# Patient Record
Sex: Female | Born: 1990 | Hispanic: Yes | Marital: Married | Smoking: Former smoker
Health system: Southern US, Community
[De-identification: ages and names within clinical notes are randomized; demographics above are authoritative.]

## PROBLEM LIST (undated history)

## (undated) DIAGNOSIS — B009 Herpesviral infection, unspecified: Secondary | ICD-10-CM

## (undated) DIAGNOSIS — M549 Dorsalgia, unspecified: Secondary | ICD-10-CM

## (undated) DIAGNOSIS — Z789 Other specified health status: Secondary | ICD-10-CM

## (undated) DIAGNOSIS — G8929 Other chronic pain: Secondary | ICD-10-CM

## (undated) DIAGNOSIS — K8020 Calculus of gallbladder without cholecystitis without obstruction: Secondary | ICD-10-CM

## (undated) HISTORY — PX: NO PAST SURGERIES: SHX2092

## (undated) HISTORY — PX: CHOLECYSTECTOMY: SHX55

---
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
## 2012-05-24 ENCOUNTER — Emergency Department (HOSPITAL_COMMUNITY): Payer: Self-pay

## 2012-05-24 ENCOUNTER — Encounter (HOSPITAL_COMMUNITY): Payer: Self-pay | Admitting: Emergency Medicine

## 2012-05-24 ENCOUNTER — Emergency Department (HOSPITAL_COMMUNITY)
Admission: EM | Admit: 2012-05-24 | Discharge: 2012-05-24 | Disposition: A | Discharge status: Home / Self Care | Payer: Self-pay | Attending: Emergency Medicine | Admitting: Emergency Medicine

## 2012-05-24 DIAGNOSIS — R197 Diarrhea, unspecified: Secondary | ICD-10-CM | POA: Insufficient documentation

## 2012-05-24 DIAGNOSIS — R1110 Vomiting, unspecified: Secondary | ICD-10-CM | POA: Insufficient documentation

## 2012-05-24 DIAGNOSIS — K529 Noninfective gastroenteritis and colitis, unspecified: Secondary | ICD-10-CM

## 2012-05-24 DIAGNOSIS — K5289 Other specified noninfective gastroenteritis and colitis: Secondary | ICD-10-CM | POA: Insufficient documentation

## 2012-05-24 LAB — COMPREHENSIVE METABOLIC PANEL
AST: 14 U/L (ref 0–37)
BUN: 9 mg/dL (ref 6–23)
CO2: 23 mEq/L (ref 19–32)
Calcium: 9.9 mg/dL (ref 8.4–10.5)
Chloride: 100 mEq/L (ref 96–112)
Creatinine, Ser: 0.54 mg/dL (ref 0.50–1.10)
GFR calc Af Amer: >90 mL/min (ref >90)
GFR calc non Af Amer: >90 mL/min (ref >90)
Glucose, Bld: 101 mg/dL — ABNORMAL HIGH (ref 70–99)
Total Bilirubin: 0.5 mg/dL (ref 0.3–1.2)

## 2012-05-24 LAB — LIPASE, BLOOD: Lipase: 13 U/L (ref 11–59)

## 2012-05-24 LAB — URINALYSIS, ROUTINE W REFLEX MICROSCOPIC
Hgb urine dipstick: NEGATIVE
Ketones, ur: 40 mg/dL — AB
Specific Gravity, Urine: >1.03 — ABNORMAL HIGH (ref 1.005–1.030)
Urobilinogen, UA: 0.2 mg/dL (ref 0.0–1.0)
pH: 6 (ref 5.0–8.0)

## 2012-05-24 LAB — PREGNANCY, URINE: Preg Test, Ur: NEGATIVE

## 2012-05-24 MED ORDER — SODIUM CHLORIDE 0.9 % IV SOLN
1000.0000 mL | INTRAVENOUS | Status: DC
  Administered 2012-05-24: 1000 mL via INTRAVENOUS

## 2012-05-24 MED ORDER — ONDANSETRON HCL 4 MG/2ML IJ SOLN
4.0000 mg | Freq: Once | INTRAMUSCULAR | Status: AC
  Administered 2012-05-24: 4 mg via INTRAVENOUS
  Filled 2012-05-24: qty 2

## 2012-05-24 MED ORDER — ONDANSETRON HCL 4 MG PO TABS: 4.0000 mg | ORAL_TABLET | Freq: Four times a day (QID) | ORAL | Status: DC

## 2012-05-24 NOTE — ED Notes (Signed)
Pt states upper abdominal pain which began at 0700 along with N/V/D. States she has vomited x 4 today, the last time being in waiting area. Unable to obtain urine sample at this time.

## 2012-05-24 NOTE — ED Notes (Signed)
Patient states she is feeling much better, denies nausea, denies abdominal pain and is requesting po fluids.

## 2012-05-24 NOTE — ED Notes (Signed)
Pt informed to let nurse know when nausea is better so she can drink fluids.

## 2012-05-24 NOTE — ED Provider Notes (Signed)
History     CSN: [id number]  Arrival date & time 05/24/12  1458   First MD Initiated Contact with Patient 05/24/12 1608      Chief Complaint  Patient presents with  . Abdominal Pain  . Emesis  . Diarrhea    (Consider location/radiation/quality/duration/timing/severity/associated sxs/prior treatment) HPI Comments: Stacy Wall is a 21 y.o. Female who presents for evaluation of nausea, vomiting, diarrhea, that started today. She denies fever, headache, weakness, or dizziness. She cannot recall her last menstrual cycle. She has implanted contraception. There are no modifying factors.  Patient is a 21 y.o. female presenting with abdominal pain, vomiting, and diarrhea. The history is provided by the patient.  Abdominal Pain The primary symptoms of the illness include abdominal pain, vomiting and diarrhea.  Emesis  Associated symptoms include abdominal pain and diarrhea.  Diarrhea The primary symptoms include abdominal pain, vomiting and diarrhea.    History reviewed. No pertinent past medical history.  History reviewed. No pertinent past surgical history.  No family history on file.  History  Substance Use Topics  . Smoking status: Never Smoker   . Smokeless tobacco: Not on file  . Alcohol Use: No    OB History    Grav Para Term Preterm Abortions TAB SAB Ect Mult Living                  Review of Systems  Gastrointestinal: Positive for vomiting, abdominal pain and diarrhea.  All other systems reviewed and are negative.    Allergies  Review of patient's allergies indicates no known allergies.  Home Medications   Current Outpatient Rx  Name Route Sig Dispense Refill  . ONDANSETRON HCL 4 MG PO TABS Oral Take 1 tablet (4 mg total) by mouth every 6 (six) hours. 12 tablet 0    BP 107/66  Pulse 68  Temp 97.8 F (36.6 C) (Oral)  Resp 16  Ht 5\' 4"  (1.626 m)  Wt 136 lb (61.689 kg)  BMI 23.34 kg/m2  SpO2 100%  Physical Exam  Nursing note and vitals  reviewed. Constitutional: She is oriented to person, place, and time. She appears well-developed and well-nourished.  HENT:  Head: Normocephalic and atraumatic.  Eyes: Conjunctivae normal and EOM are normal. Pupils are equal, round, and reactive to light.  Neck: Normal range of motion and phonation normal. Neck supple.  Cardiovascular: Normal rate, regular rhythm and intact distal pulses.   Pulmonary/Chest: Effort normal and breath sounds normal. She exhibits no tenderness.  Abdominal: Soft. She exhibits no distension and no mass. There is tenderness (mild epigastric). There is no guarding.  Musculoskeletal: Normal range of motion.  Neurological: She is alert and oriented to person, place, and time. She has normal strength. She exhibits normal muscle tone.  Skin: Skin is warm and dry.  Psychiatric: She has a normal mood and affect. Her behavior is normal. Judgment and thought content normal.    ED Course  Procedures (including critical care time)  Emergency department treatment: IV fluids, and IV Zofran. Trial of oral fluids  Reevaluation at discharge: comfortable, and tolerating oral fluids.   Nursing notes, applicable records and vitals reviewed.repeat vital signs, stable  Radiologic Images/Reports reviewed.   Labs Reviewed  URINALYSIS, ROUTINE W REFLEX MICROSCOPIC - Abnormal; Notable for the following:    Specific Gravity, Urine >1.030 (*)     Bilirubin Urine SMALL (*)     Ketones, ur 40 (*)     All other components within normal limits  COMPREHENSIVE METABOLIC  PANEL - Abnormal; Notable for the following:    Glucose, Bld 101 (*)     Total Protein 8.6 (*)     All other components within normal limits  PREGNANCY, URINE  LIPASE, BLOOD   Dg Abd Acute W/chest  05/24/2012  *RADIOLOGY REPORT*  Clinical Data: Acute to that the abdominal pain and emesis. Diarrhea.  ACUTE ABDOMEN SERIES (ABDOMEN 2 VIEW & CHEST 1 VIEW)  Comparison: None.  Findings: The heart size is normal.  The  lungs are clear.  Gaseous distended loops of small bowel are present within the lower abdomen and pelvis.  There is no evidence for obstruction or free air.  Normal appearing gas is present throughout the colon.  The axial skeleton is within normal limits.  IMPRESSION:  1.  Mild gaseous distention of loops of the small bowel may represent a focal ileus. 2.  Normal appearance of the colon.   Original Report Authenticated By: Jamesetta Orleans. MATTERN, M.D.      1. Gastroenteritis       MDM  Senses symptoms of viral gastroenteritis.  Patient is nontoxic.Doubt metabolic instability, serious bacterial infection or impending vascular collapse; the patient is stable for discharge.    Plan: Home Medications- Zofran; Home Treatments- gradually advance diet; Recommended follow up- PCP of choice when necessary.         Flint Melter, MD 05/24/12 2203

## 2012-05-24 NOTE — ED Notes (Signed)
Pt c/o upper abd pain with n/v/d today

## 2012-05-24 NOTE — ED Notes (Signed)
Pt has had no vomiting since arrival to room.

## 2012-06-22 ENCOUNTER — Encounter (HOSPITAL_COMMUNITY): Payer: Self-pay | Admitting: Emergency Medicine

## 2012-06-22 ENCOUNTER — Emergency Department (HOSPITAL_COMMUNITY)
Admission: EM | Admit: 2012-06-22 | Discharge: 2012-06-22 | Disposition: A | Discharge status: Home / Self Care | Payer: Self-pay | Attending: Emergency Medicine | Admitting: Emergency Medicine

## 2012-06-22 DIAGNOSIS — L0390 Cellulitis, unspecified: Secondary | ICD-10-CM

## 2012-06-22 DIAGNOSIS — L0231 Cutaneous abscess of buttock: Secondary | ICD-10-CM | POA: Insufficient documentation

## 2012-06-22 MED ORDER — CEPHALEXIN 500 MG PO CAPS: ORAL_CAPSULE | ORAL | Status: DC

## 2012-06-22 MED ORDER — OXYCODONE-ACETAMINOPHEN 5-325 MG PO TABS: 2.0000 | ORAL_TABLET | ORAL | Status: DC | PRN

## 2012-06-22 MED ORDER — SULFAMETHOXAZOLE-TRIMETHOPRIM 800-160 MG PO TABS: 1.0000 | ORAL_TABLET | Freq: Two times a day (BID) | ORAL | Status: DC

## 2012-06-22 NOTE — ED Notes (Signed)
Pt c/o abscess to right buttock.  

## 2012-06-22 NOTE — ED Provider Notes (Signed)
History   This chart was scribed for Hurman Horn, MD by Charolett Bumpers, ED Scribe. The patient was seen in room APA11/[facility]11. Patient's care was started at 1539.   CSN: [id number]  Arrival date & time 06/22/12  1447   First MD Initiated Contact with Patient 06/22/12 1539      Chief Complaint  Patient presents with  . Abscess    The history is provided by the patient. No language interpreter was used.  Stacy Wall is a 21 y.o. female who presents to the Emergency Department complaining of an abscess to her right buttocks. She describes the associated pain as constant and severe, that is worse with palpation. She denies any modifying factors. She denies any fevers, nausea, vomiting, abdominal pain, chest pain, SOB or rashes. She states that she is otherwise normally healthy.    History reviewed. No pertinent past medical history.  History reviewed. No pertinent past surgical history.  No family history on file.  History  Substance Use Topics  . Smoking status: Never Smoker   . Smokeless tobacco: Not on file  . Alcohol Use: No    OB History    Grav Para Term Preterm Abortions TAB SAB Ect Mult Living                  Review of Systems A complete 10 system review of systems was obtained and all systems are negative except as noted in the HPI and PMH.   Allergies  Review of patient's allergies indicates no known allergies.  Home Medications   Current Outpatient Rx  Name  Route  Sig  Dispense  Refill  . CEPHALEXIN 500 MG PO CAPS      2 caps po bid x 7 days   28 capsule   0   . ONDANSETRON HCL 4 MG PO TABS   Oral   Take 1 tablet (4 mg total) by mouth every 6 (six) hours.   12 tablet   0   . OXYCODONE-ACETAMINOPHEN 5-325 MG PO TABS   Oral   Take 2 tablets by mouth every 4 (four) hours as needed for pain.   20 tablet   0   . SULFAMETHOXAZOLE-TRIMETHOPRIM 800-160 MG PO TABS   Oral   Take 1 tablet by mouth 2 (two) times daily. One po bid x 7  days   14 tablet   0   . SULFAMETHOXAZOLE-TRIMETHOPRIM 800-160 MG PO TABS   Oral   Take 1 tablet by mouth every 12 (twelve) hours.   20 tablet   0     BP 131/84  Pulse 90  Temp 97.5 F (36.4 C)  Resp 18  Ht 5\' 4"  (1.626 m)  Wt 140 lb (63.504 kg)  BMI 24.03 kg/m2  SpO2 97%  Physical Exam  Nursing note and vitals reviewed. Constitutional:       Awake, alert, nontoxic appearance.  HENT:  Head: Atraumatic.  Eyes: Right eye exhibits no discharge. Left eye exhibits no discharge.  Neck: Neck supple.  Cardiovascular: Normal rate, regular rhythm and normal heart sounds.   No murmur heard. Pulmonary/Chest: Effort normal and breath sounds normal. No respiratory distress. She has no wheezes. She has no rales. She exhibits no tenderness.  Abdominal: Soft. There is no tenderness. There is no rebound.  Musculoskeletal: She exhibits no tenderness.       Baseline ROM, no obvious new focal weakness.  Neurological:       Mental status and motor strength appears  baseline for patient and situation.  Skin: No rash noted. There is erythema.       Right lateral buttocks, a 2 cm subcutaneous nodule that is firm with 3 cm diameter of mild erythema.  Psychiatric: She has a normal mood and affect.    ED Course  Procedures (including critical care time)  DIAGNOSTIC STUDIES: Oxygen Saturation is 97% on room air, adequate by my interpretation.    COORDINATION OF CARE:  15:45-Patient / Family / Caregiver understand and agree with initial ED impression and plan with expectations set for ED visit.   15:50-Limited bedside ultrasound preformed, no obvious focal fluid collection to suggest abscess is read for drainage.   15:55-Patient / Family / Caregiver informed of clinical course, understand medical decision-making process, and agree with plan. Will d/c pt home with antibiotics and discussed using warm compresses and f/u in 2 days for recheck   Labs Reviewed - No data to display No results  found.   1. Cellulitis       MDM    I personally performed the services described in this documentation, which was scribed in my presence. The recorded information has been reviewed and is accurate.  Pt stable in ED with no significant deterioration in condition.  Patient / Family / Caregiver informed of clinical course, understand medical decision-making process, and agree with plan.  I doubt any other EMC precluding discharge at this time including, but not necessarily limited to the following:abscess, sepsis, nec fasc.     Hurman Horn, MD 06/25/12 [phone]

## 2012-06-22 NOTE — ED Notes (Signed)
Pt presents with abscess on buttocks that she first noticed "a few gays ago". Pt states "I think i was bit by something".

## 2012-06-25 ENCOUNTER — Encounter (HOSPITAL_COMMUNITY): Payer: Self-pay | Admitting: Emergency Medicine

## 2012-06-25 ENCOUNTER — Emergency Department (HOSPITAL_COMMUNITY)
Admission: EM | Admit: 2012-06-25 | Discharge: 2012-06-25 | Disposition: A | Discharge status: Home / Self Care | Payer: Self-pay | Attending: Emergency Medicine | Admitting: Emergency Medicine

## 2012-06-25 DIAGNOSIS — L0231 Cutaneous abscess of buttock: Secondary | ICD-10-CM | POA: Insufficient documentation

## 2012-06-25 DIAGNOSIS — Z79899 Other long term (current) drug therapy: Secondary | ICD-10-CM | POA: Insufficient documentation

## 2012-06-25 MED ORDER — LIDOCAINE-EPINEPHRINE (PF) 1 %-1:200000 IJ SOLN
10.0000 mL | Freq: Once | INTRAMUSCULAR | Status: AC
  Administered 2012-06-25: 10 mL

## 2012-06-25 MED ORDER — SULFAMETHOXAZOLE-TRIMETHOPRIM 800-160 MG PO TABS: 1.0000 | ORAL_TABLET | Freq: Two times a day (BID) | ORAL | Status: DC

## 2012-06-25 MED ORDER — LIDOCAINE-EPINEPHRINE (PF) 1 %-1:200000 IJ SOLN
INTRAMUSCULAR | Status: AC
  Administered 2012-06-25: 10 mL
  Filled 2012-06-25: qty 10

## 2012-06-25 NOTE — ED Notes (Signed)
When edp arrived, pt stated was prescribed meds on Monday when she was here but only got the pain meds filled. Pt instructed the importance of anitbiotics and to take all of them.

## 2012-06-25 NOTE — ED Provider Notes (Signed)
History     CSN: [id number]  Arrival date & time 06/25/12  4098   First MD Initiated Contact with Patient 06/25/12 [phone]      Chief Complaint  Patient presents with  . Abscess    (Consider location/radiation/quality/duration/timing/severity/associated sxs/prior treatment) HPI Comments: 21 year old female with a history of a recent visit for an infection in the right buttock who presents with a complaint of worsening swelling and pain. This started one week ago, gradual in onset, gradually worsening, persistent, worse with sitting, not associated with fevers chills nausea or vomiting. She denies being a diabetic, she denies any risk factors for HIV and states that she thinks that she was bitten by an insect and then try to mash the area that was read after which time he became worse, more swollen, more tender and now has redness which is spreading circumferentially.  She admits that after her last visit she was prescribed several medications but only filled the pain medication and chose not to fill the antibiotics.  Patient is a 21 y.o. female presenting with abscess. The history is provided by the patient and medical records.  Abscess  Pertinent negatives include no fever.    History reviewed. No pertinent past medical history.  History reviewed. No pertinent past surgical history.  History reviewed. No pertinent family history.  History  Substance Use Topics  . Smoking status: Never Smoker   . Smokeless tobacco: Not on file  . Alcohol Use: No    OB History    Grav Para Term Preterm Abortions TAB SAB Ect Mult Living                  Review of Systems  Constitutional: Negative for fever.  Skin: Positive for rash.    Allergies  Review of patient's allergies indicates no known allergies.  Home Medications   Current Outpatient Rx  Name  Route  Sig  Dispense  Refill  . CEPHALEXIN 500 MG PO CAPS      2 caps po bid x 7 days   28 capsule   0   . ONDANSETRON HCL 4 MG  PO TABS   Oral   Take 1 tablet (4 mg total) by mouth every 6 (six) hours.   12 tablet   0   . OXYCODONE-ACETAMINOPHEN 5-325 MG PO TABS   Oral   Take 2 tablets by mouth every 4 (four) hours as needed for pain.   20 tablet   0   . SULFAMETHOXAZOLE-TRIMETHOPRIM 800-160 MG PO TABS   Oral   Take 1 tablet by mouth 2 (two) times daily. One po bid x 7 days   14 tablet   0   . SULFAMETHOXAZOLE-TRIMETHOPRIM 800-160 MG PO TABS   Oral   Take 1 tablet by mouth every 12 (twelve) hours.   20 tablet   0     BP 118/72  Pulse 102  Temp 98.5 F (36.9 C) (Oral)  Resp 20  SpO2 98%  Physical Exam  Constitutional: She appears well-developed and well-nourished. No distress.  HENT:  Head: Normocephalic and atraumatic.  Eyes: Conjunctivae normal are normal. No scleral icterus.  Pulmonary/Chest: Effort normal.  Musculoskeletal: Normal range of motion. She exhibits tenderness (focal tenderness over the right lateral buttock). She exhibits no edema.  Neurological: She is alert. Coordination normal.       Normal steady gait, normal speech, normal memory. Follows commands without difficulty  Skin: Skin is warm and dry. Rash noted. She is not diaphoretic. There  is erythema.       6 cm area of erythema with induration, small amount of central fluctuance.    ED Course  Procedures (including critical care time)  Labs Reviewed - No data to display No results found.   1. Abscess of right buttock       MDM  Due to the area of erythema on the right buttock, incision and drainage was performed with a incision from a #11 scalpel.  The patient is not a diabetic, has normal vital signs including no fever, no hypotension and has no risk factors for HIV. It seems that the inciting event for her abscess was a break in the skin related to an insect bite. At this time she should be treated adequately with incision and drainage followed by an antibiotic for this rounding small amount of cellulitis. The  patient is stable for discharge, I have explained her discharge instructions and the indications for return, she is expressed her understanding.  INCISION AND DRAINAGE Performed by: Eber Hong D Consent: Verbal consent obtained. Risks and benefits: risks, benefits and alternatives were discussed Type: abscess  Body area: Right buttock  Anesthesia: local infiltration  Incision was made with a scalpel.  Local anesthetic: lidocaine 1 % with epinephrine  Anesthetic total: 3 ml  Complexity: complex Blunt dissection to break up loculations, irrigation   Drainage: purulent  Drainage amount: Moderate   Packing material: 1/4 in iodoform gauze  Patient tolerance: Patient tolerated the procedure well with no immediate complications.           Vida Roller, MD 06/25/12 [phone]

## 2012-06-25 NOTE — ED Notes (Signed)
Pt c/o boil to r buttock x 1 week. Here Monday for same but states nothing done. Nad. Denies draining. States has gotten bigger.

## 2012-07-13 ENCOUNTER — Emergency Department (HOSPITAL_COMMUNITY): Admission: EM | Admit: 2012-07-13 | Discharge: 2012-07-13 | Discharge status: Against Medical Advice | Payer: Self-pay

## 2012-07-13 ENCOUNTER — Emergency Department (HOSPITAL_COMMUNITY)
Admission: EM | Admit: 2012-07-13 | Discharge: 2012-07-13 | Disposition: A | Discharge status: Home / Self Care | Payer: Self-pay | Attending: Emergency Medicine | Admitting: Emergency Medicine

## 2012-07-13 ENCOUNTER — Encounter (HOSPITAL_COMMUNITY): Payer: Self-pay | Admitting: *Deleted

## 2012-07-13 DIAGNOSIS — Z3202 Encounter for pregnancy test, result negative: Secondary | ICD-10-CM | POA: Insufficient documentation

## 2012-07-13 DIAGNOSIS — A6000 Herpesviral infection of urogenital system, unspecified: Secondary | ICD-10-CM | POA: Insufficient documentation

## 2012-07-13 LAB — URINALYSIS, ROUTINE W REFLEX MICROSCOPIC
Ketones, ur: NEGATIVE mg/dL
Leukocytes, UA: NEGATIVE
Nitrite: NEGATIVE
Protein, ur: NEGATIVE mg/dL
Urobilinogen, UA: 0.2 mg/dL (ref 0.0–1.0)

## 2012-07-13 LAB — WET PREP, GENITAL

## 2012-07-13 LAB — POCT PREGNANCY, URINE: Preg Test, Ur: NEGATIVE

## 2012-07-13 MED ORDER — HYDROCODONE-ACETAMINOPHEN 5-325 MG PO TABS: 1.0000 | ORAL_TABLET | ORAL | Status: DC | PRN

## 2012-07-13 MED ORDER — ACYCLOVIR 400 MG PO TABS: 400.0000 mg | ORAL_TABLET | Freq: Every day | ORAL | Status: DC

## 2012-07-13 NOTE — ED Notes (Signed)
Pt with rash to vagina since Friday, denies itching, denies shaving, denies any abnormal vaginal d/c

## 2012-07-13 NOTE — ED Provider Notes (Signed)
History    This chart was scribed for Stacy Booze, MD, MD by Smitty Pluck, ED Scribe. The patient was seen in room APA12 and the patient's care was started at 7:47PM.   CSN: [id number]  Arrival date & time 07/13/12  1750      Chief Complaint  Patient presents with  . Rash    (Consider location/radiation/quality/duration/timing/severity/associated sxs/prior treatment) The history is provided by the patient. No language interpreter was used.   Stacy Wall is a 21 y.o. female who presents to the Emergency Department complaining of constant, moderate vaginal rash onset 3 days ago. Pt reports having pain in area of rash while urinating. She denies itching, vaginal discharge, fever, nausea, vomiting, dysuria, back pain, abdominal pain and any other pain. Pt reports that she has vaginal bleeding after sexual intercourse for past 2 weeks.   History reviewed. No pertinent past medical history.  History reviewed. No pertinent past surgical history.  History reviewed. No pertinent family history.  History  Substance Use Topics  . Smoking status: Never Smoker   . Smokeless tobacco: Not on file  . Alcohol Use: No    OB History    Grav Para Term Preterm Abortions TAB SAB Ect Mult Living                  Review of Systems  All other systems reviewed and are negative.    Allergies  Review of patient's allergies indicates no known allergies.  Home Medications   Current Outpatient Rx  Name  Route  Sig  Dispense  Refill  . ONDANSETRON HCL 4 MG PO TABS   Oral   Take 1 tablet (4 mg total) by mouth every 6 (six) hours.   12 tablet   0     BP 132/86  Pulse 63  Temp 98.1 F (36.7 C) (Oral)  Resp 20  Ht 5\' 4"  (1.626 m)  Wt 143 lb 4 oz (64.978 kg)  BMI 24.59 kg/m2  SpO2 100%  Physical Exam  Nursing note and vitals reviewed. Constitutional: She is oriented to person, place, and time. She appears well-developed and well-nourished. No distress.  HENT:  Head:  Normocephalic and atraumatic.  Eyes: Conjunctivae normal are normal.  Cardiovascular: Normal rate, regular rhythm and normal heart sounds.   Pulmonary/Chest: Effort normal. No respiratory distress.  Abdominal: Soft. She exhibits no distension. There is no tenderness.  Genitourinary:       Normal external female genitalia. There is a vesicle on an erythematous base in the right perineal area. This vesicle is tender and is most consistent with herpes simplex. Cervix is normal. Fundus is normal size and position. There is no cervical motion tenderness. There are no adnexal masses or tenderness.  Neurological: She is oriented to person, place, and time.  Skin: Skin is warm and dry.  Psychiatric: She has a normal mood and affect. Her behavior is normal.    ED Course  Procedures (including critical care time) DIAGNOSTIC STUDIES: Oxygen Saturation is 100% on room air, normal by my interpretation.    COORDINATION OF CARE: 7:50 PM Discussed ED treatment with pt    1. Herpes genitalis       MDM  Painful rash which seems to be lesion of herpes simplex. Vaginal discharge of uncertain cause-wet prep is pending. Recent records were reviewed, and she has been on antibiotics for her cellulitis and perirectal abscess, so she would be at risk for vaginal yeast infections.  Wet prep shows no evidence  of yeast. She'll be sent home with prescription for acyclovir your and also a prescription for Norco for pain.  I personally performed the services described in this documentation, which was scribed in my presence. The recorded information has been reviewed and is accurate.         Stacy Booze, MD 07/13/12 2312

## 2012-07-13 NOTE — ED Notes (Signed)
Pt discharged. Pt stable at time of discharge. Medications reviewed pt has no questions regarding discharge at this time. Pt voiced understanding of discharge instructions.  

## 2012-07-13 NOTE — ED Notes (Signed)
Pt called for second time for triage without response

## 2012-07-13 NOTE — ED Notes (Signed)
Called for third time without response  

## 2012-07-13 NOTE — ED Notes (Signed)
Called for triage x1 without response 

## 2012-07-14 LAB — RPR: RPR Ser Ql: NONREACTIVE

## 2012-07-14 LAB — HIV ANTIBODY (ROUTINE TESTING W REFLEX): HIV: NONREACTIVE

## 2012-08-05 ENCOUNTER — Emergency Department (HOSPITAL_COMMUNITY)
Admission: EM | Admit: 2012-08-05 | Discharge: 2012-08-05 | Disposition: A | Discharge status: Home / Self Care | Payer: Self-pay | Attending: Emergency Medicine | Admitting: Emergency Medicine

## 2012-08-05 ENCOUNTER — Encounter (HOSPITAL_COMMUNITY): Payer: Self-pay | Admitting: *Deleted

## 2012-08-05 DIAGNOSIS — R21 Rash and other nonspecific skin eruption: Secondary | ICD-10-CM | POA: Insufficient documentation

## 2012-08-05 DIAGNOSIS — J3489 Other specified disorders of nose and nasal sinuses: Secondary | ICD-10-CM | POA: Insufficient documentation

## 2012-08-05 DIAGNOSIS — J329 Chronic sinusitis, unspecified: Secondary | ICD-10-CM | POA: Insufficient documentation

## 2012-08-05 DIAGNOSIS — R0982 Postnasal drip: Secondary | ICD-10-CM | POA: Insufficient documentation

## 2012-08-05 DIAGNOSIS — A6000 Herpesviral infection of urogenital system, unspecified: Secondary | ICD-10-CM | POA: Insufficient documentation

## 2012-08-05 HISTORY — DX: Herpesviral infection, unspecified: B00.9

## 2012-08-05 MED ORDER — PSEUDOEPHEDRINE HCL 60 MG PO TABS: ORAL_TABLET | ORAL | Status: DC

## 2012-08-05 MED ORDER — PSEUDOEPHEDRINE HCL 60 MG PO TABS
60.0000 mg | ORAL_TABLET | Freq: Once | ORAL | Status: AC
  Administered 2012-08-05: 60 mg via ORAL
  Filled 2012-08-05: qty 1

## 2012-08-05 MED ORDER — ACYCLOVIR 400 MG PO TABS: 400.0000 mg | ORAL_TABLET | Freq: Every day | ORAL | Status: DC

## 2012-08-05 NOTE — ED Notes (Addendum)
Pt says she has  outbreak of genital herpes.needs meds. Headache for 2 days

## 2012-08-05 NOTE — ED Provider Notes (Signed)
Medical screening examination/treatment/procedure(s) were performed by non-physician practitioner and as supervising physician I was immediately available for consultation/collaboration.   Arron Mcnaught L Matin Mattioli, MD 08/05/12 1523 

## 2012-08-05 NOTE — ED Provider Notes (Signed)
History     CSN: [id number]  Arrival date & time 08/05/12  1346   First MD Initiated Contact with Patient 08/05/12 1401      Chief Complaint  Patient presents with  . Pelvic Pain    (Consider location/radiation/quality/duration/timing/severity/associated sxs/prior treatment) Patient is a 22 y.o. female presenting with pelvic pain. The history is provided by the patient.  Pelvic Pain This is a recurrent problem. The current episode started 1 to 4 weeks ago. The problem occurs constantly. The problem has been gradually worsening. Associated symptoms include congestion and a rash. Pertinent negatives include no abdominal pain, arthralgias, chest pain, coughing, neck pain or sore throat. Associated symptoms comments: Vaginal and perineal pain. Nothing aggravates the symptoms. Treatments tried: finished acyclovir 3 to 4 days ago. The treatment provided no relief.    Past Medical History  Diagnosis Date  . Herpes     History reviewed. No pertinent past surgical history.  History reviewed. No pertinent family history.  History  Substance Use Topics  . Smoking status: Never Smoker   . Smokeless tobacco: Not on file  . Alcohol Use: No    OB History    Grav Para Term Preterm Abortions TAB SAB Ect Mult Living                  Review of Systems  Constitutional: Negative for activity change.       All ROS Neg except as noted in HPI  HENT: Positive for congestion and postnasal drip. Negative for nosebleeds, sore throat and neck pain.   Eyes: Negative for photophobia and discharge.  Respiratory: Negative for cough, shortness of breath and wheezing.   Cardiovascular: Negative for chest pain and palpitations.  Gastrointestinal: Negative for abdominal pain and blood in stool.  Genitourinary: Positive for genital sores and pelvic pain. Negative for dysuria, frequency and hematuria.  Musculoskeletal: Negative for back pain and arthralgias.  Skin: Positive for rash.  Neurological:  Negative for dizziness, seizures and speech difficulty.  Psychiatric/Behavioral: Negative for hallucinations and confusion.    Allergies  Review of patient's allergies indicates no known allergies.  Home Medications   Current Outpatient Rx  Name  Route  Sig  Dispense  Refill  . ACYCLOVIR 400 MG PO TABS   Oral   Take 1 tablet (400 mg total) by mouth 5 (five) times daily.   50 tablet   0   . HYDROCODONE-ACETAMINOPHEN 5-325 MG PO TABS   Oral   Take 1 tablet by mouth every 4 (four) hours as needed for pain.   15 tablet   0   . IBUPROFEN 200 MG PO TABS   Oral   Take 400 mg by mouth every 6 (six) hours as needed. For pain         . ACYCLOVIR 400 MG PO TABS   Oral   Take 1 tablet (400 mg total) by mouth 5 (five) times daily.   50 tablet   0   . PSEUDOEPHEDRINE HCL 60 MG PO TABS      1 po tid for congestion   30 tablet   0     BP 120/80  Pulse 73  Temp 98.1 F (36.7 C) (Oral)  Resp 20  Ht 5\' 5"  (1.651 m)  Wt 144 lb 6 oz (65.488 kg)  BMI 24.03 kg/m2  SpO2 100%  Physical Exam  Nursing note and vitals reviewed. Constitutional: She is oriented to person, place, and time. She appears well-developed and well-nourished.  Non-toxic appearance.  HENT:  Head: Normocephalic.  Right Ear: Tympanic membrane and external ear normal.  Left Ear: Tympanic membrane and external ear normal.       Nasal congestion present.  Eyes: EOM and lids are normal. Pupils are equal, round, and reactive to light.  Neck: Normal range of motion. Neck supple. Carotid bruit is not present.  Cardiovascular: Normal rate, regular rhythm, normal heart sounds, intact distal pulses and normal pulses.   Pulmonary/Chest: Breath sounds normal. No respiratory distress.  Abdominal: Soft. Bowel sounds are normal. There is no tenderness. There is no guarding.  Genitourinary:       The labia majora are well within normal limits. There no lesions noted of the clitoral shaft. There are clusters of ulcers with  an erythematous base of the external labia and extending into the perineal area. There is no red streaking present. There no hot areas appreciated. And there is no palpable inguinal nodes appreciated.  Musculoskeletal: Normal range of motion.  Lymphadenopathy:       Head (right side): No submandibular adenopathy present.       Head (left side): No submandibular adenopathy present.    She has no cervical adenopathy.  Neurological: She is alert and oriented to person, place, and time. She has normal strength. No cranial nerve deficit or sensory deficit.  Skin: Skin is warm and dry.  Psychiatric: She has a normal mood and affect. Her speech is normal.    ED Course  Procedures (including critical care time)  Labs Reviewed - No data to display No results found.   1. Herpes genitalia   2. Sinusitis       MDM  I have reviewed nursing notes, vital signs, and all appropriate lab and imaging results for this patient.  Patient has a history of herpes genitalia. She was treated recently with acyclovir. She finished this approximately 2-3 days ago and had another outbreak. The patient does state that she has had a cold recently, she also states she's been under increased stress recently. The patient was found to have what appears to be herpetic lesions of the external labia extending into the perineal area. The patient was treated with acyclovir and advised to see the physicians at the [facility] department for additional testing and management. The patient also has nasal congestion and was treated with Sudafed 3 times daily.      Kathie Dike, [place] 08/05/12 1521

## 2012-11-26 ENCOUNTER — Encounter (HOSPITAL_COMMUNITY): Payer: Self-pay | Admitting: Adult Health

## 2012-11-26 ENCOUNTER — Emergency Department (HOSPITAL_COMMUNITY)
Admission: EM | Admit: 2012-11-26 | Discharge: 2012-11-26 | Disposition: A | Discharge status: Home / Self Care | Payer: Self-pay | Attending: Emergency Medicine | Admitting: Emergency Medicine

## 2012-11-26 DIAGNOSIS — A6000 Herpesviral infection of urogenital system, unspecified: Secondary | ICD-10-CM | POA: Insufficient documentation

## 2012-11-26 DIAGNOSIS — Z760 Encounter for issue of repeat prescription: Secondary | ICD-10-CM | POA: Insufficient documentation

## 2012-11-26 MED ORDER — ACYCLOVIR 400 MG PO TABS: 400.0000 mg | ORAL_TABLET | Freq: Three times a day (TID) | ORAL | Status: DC

## 2012-11-26 NOTE — ED Provider Notes (Signed)
History    This chart was scribed for Marlon Pel (PA) non-physician practitioner working with Doug Sou, MD by Sofie Rower, ED Scribe. This patient was seen in room TR07C/TR07C and the patient's care was started at 3:52PM.   CSN: [id number]  Arrival date & time 11/26/12  1536   First MD Initiated Contact with Patient 11/26/12 1552      Chief Complaint  Patient presents with  . Medication Refill    (Consider location/radiation/quality/duration/timing/severity/associated sxs/prior treatment) The history is provided by the patient. No language interpreter was used.    Stacy Wall is a 22 y.o. female , with a hx of herpres, who presents to the Emergency Department complaining of Acyclovir medication refill, onset today (11/26/12).  The pt reports she is experiencing a genital herpes outbreak at the present time and has recently run out of her regularly scheduled acyclovir medication. No other symptoms at this time.  The pt does not smoke or drink alcohol.   PCP is Dr.    Past Medical History  Diagnosis Date  . Herpes     History reviewed. No pertinent past surgical history.  History reviewed. No pertinent family history.  History  Substance Use Topics  . Smoking status: Never Smoker   . Smokeless tobacco: Not on file  . Alcohol Use: No    OB History   Grav Para Term Preterm Abortions TAB SAB Ect Mult Living                  Review of Systems  All other systems reviewed and are negative.    Allergies  Review of patient's allergies indicates no known allergies.  Home Medications   Current Outpatient Rx  Name  Route  Sig  Dispense  Refill  . acyclovir (ZOVIRAX) 400 MG tablet   Oral   Take 1 tablet (400 mg total) by mouth 3 (three) times daily.   30 tablet   2     BP 108/65  Pulse 94  Temp(Src) 98 F (36.7 C) (Oral)  Resp 16  SpO2 95%  Physical Exam  Nursing note and vitals reviewed. Constitutional: She is oriented to person, place, and  time. She appears well-developed and well-nourished. No distress.  HENT:  Head: Normocephalic and atraumatic.  Eyes: EOM are normal.  Neck: Neck supple. No tracheal deviation present.  Cardiovascular: Normal rate.   Pulmonary/Chest: Effort normal. No respiratory distress.  Genitourinary:     Musculoskeletal: Normal range of motion.  Neurological: She is alert and oriented to person, place, and time.  Skin: Skin is warm and dry.  Psychiatric: She has a normal mood and affect. Her behavior is normal.    ED Course  Procedures (including critical care time)  DIAGNOSTIC STUDIES: Oxygen Saturation is 95% on room air, normal by my interpretation.    COORDINATION OF CARE:  4:05 PM- Treatment plan discussed with patient. Pt agrees with treatment.  acyclovir (ZOVIRAX) 400 MG tablet Take 1 tablet (400 mg total) by mouth 3 (three) times daily. 30 tablet Dorthula Matas, PA-  Referral to [facility] Reviewed - No data to display No results found.   1. Genital herpes       MDM   Pt has been advised of the symptoms that warrant their return to the ED. Patient has voiced understanding and has agreed to follow-up with the PCP or specialist.  I personally performed the services described in this documentation, which was scribed in my presence. The  recorded information has been reviewed and is accurate.   Dorthula Matas, PA-C 11/26/12 1614

## 2012-11-26 NOTE — ED Notes (Addendum)
presents with herpes outbreak, ran out of RX, requesting prescription of Acyclovir

## 2012-11-26 NOTE — ED Notes (Signed)
Pt states she just needs more of her herpes medication d/t experiencing a typical herpes outbreak.

## 2012-11-27 NOTE — ED Provider Notes (Signed)
Medical screening examination/treatment/procedure(s) were performed by non-physician practitioner and as supervising physician I was immediately available for consultation/collaboration.  Ravindra Baranek, MD 11/27/12 0136 

## 2013-01-01 ENCOUNTER — Emergency Department (HOSPITAL_COMMUNITY)
Admission: EM | Admit: 2013-01-01 | Discharge: 2013-01-01 | Disposition: A | Discharge status: Home / Self Care | Payer: Self-pay | Attending: Emergency Medicine | Admitting: Emergency Medicine

## 2013-01-01 ENCOUNTER — Encounter (HOSPITAL_COMMUNITY): Payer: Self-pay | Admitting: Emergency Medicine

## 2013-01-01 ENCOUNTER — Emergency Department (HOSPITAL_COMMUNITY): Payer: Self-pay

## 2013-01-01 DIAGNOSIS — Y92838 Other recreation area as the place of occurrence of the external cause: Secondary | ICD-10-CM | POA: Insufficient documentation

## 2013-01-01 DIAGNOSIS — S63619A Unspecified sprain of unspecified finger, initial encounter: Secondary | ICD-10-CM

## 2013-01-01 DIAGNOSIS — W219XXA Striking against or struck by unspecified sports equipment, initial encounter: Secondary | ICD-10-CM | POA: Insufficient documentation

## 2013-01-01 DIAGNOSIS — Y9368 Activity, volleyball (beach) (court): Secondary | ICD-10-CM | POA: Insufficient documentation

## 2013-01-01 DIAGNOSIS — Y9239 Other specified sports and athletic area as the place of occurrence of the external cause: Secondary | ICD-10-CM | POA: Insufficient documentation

## 2013-01-01 DIAGNOSIS — Z8619 Personal history of other infectious and parasitic diseases: Secondary | ICD-10-CM | POA: Insufficient documentation

## 2013-01-01 DIAGNOSIS — S6390XA Sprain of unspecified part of unspecified wrist and hand, initial encounter: Secondary | ICD-10-CM | POA: Insufficient documentation

## 2013-01-01 MED ORDER — IBUPROFEN 600 MG PO TABS: 600.0000 mg | ORAL_TABLET | Freq: Four times a day (QID) | ORAL | Status: DC | PRN

## 2013-01-01 NOTE — ED Notes (Signed)
Pt c/o pain to right ring finger after injuring it playing volleyball yesterday.

## 2013-01-01 NOTE — ED Provider Notes (Signed)
Medical screening examination/treatment/procedure(s) were performed by non-physician practitioner and as supervising physician I was immediately available for consultation/collaboration. Devoria Albe, MD, Armando Gang   Ward Givens, MD 01/01/13 1115

## 2013-01-01 NOTE — ED Provider Notes (Signed)
History     CSN: [id number]  Arrival date & time 01/01/13  0935   First MD Initiated Contact with Patient 01/01/13 [phone]      Chief Complaint  Patient presents with  . Finger Injury    (Consider location/radiation/quality/duration/timing/severity/associated sxs/prior treatment) HPI Comments: Stacy Wall is a 22 y.o. Female with persistent pain in her right long and ring finger since she jammed the fingers on the ball during a volleyball game yesterday afternoon.  She describes persistent pain which is worse with attempts to flex her fingers with pain that radiates into her hand.  She had swelling and bruising at the base of the fingers yesterday which she states improved with ice packs.  She has taken no medications for her symptoms.  She denies numbness distal to the injury site.  She is right-handed.     The history is provided by the patient.    Past Medical History  Diagnosis Date  . Herpes     History reviewed. No pertinent past surgical history.  No family history on file.  History  Substance Use Topics  . Smoking status: Never Smoker   . Smokeless tobacco: Not on file  . Alcohol Use: No    OB History   Grav Para Term Preterm Abortions TAB SAB Ect Mult Living                  Review of Systems  Constitutional: Negative for fever.  Musculoskeletal: Positive for joint swelling and arthralgias. Negative for myalgias.  Neurological: Negative for weakness and numbness.    Allergies  Review of patient's allergies indicates no known allergies.  Home Medications   Current Outpatient Rx  Name  Route  Sig  Dispense  Refill  . ibuprofen (ADVIL,MOTRIN) 600 MG tablet   Oral   Take 1 tablet (600 mg total) by mouth every 6 (six) hours as needed for pain.   20 tablet   0     BP 118/80  Pulse 91  Temp(Src) 98.3 F (36.8 C)  Resp 18  Ht 5\' 4"  (1.626 m)  Wt 148 lb (67.132 kg)  BMI 25.39 kg/m2  SpO2 99%  Physical Exam  Constitutional: She appears  well-developed and well-nourished.  HENT:  Head: Atraumatic.  Neck: Normal range of motion.  Cardiovascular:  Pulses equal bilaterally  Musculoskeletal: She exhibits edema and tenderness.       Right hand: She exhibits decreased range of motion, bony tenderness and swelling. She exhibits normal two-point discrimination, normal capillary refill and no deformity.  Pain with palpation at proximal phalanxes of long and ring finger with radiation into her volar hand.  She has mild edema at the base of the fingers, she displays flexion and extension although reduced and with increased pain.  Less than 3 second cap refill her fingertips, no decreased sensation to fine touch.  Neurological: She is alert. She has normal strength. She displays normal reflexes. No sensory deficit.  Equal strength  Skin: Skin is warm and dry.  Psychiatric: She has a normal mood and affect.    ED Course  Procedures (including critical care time)  Labs Reviewed - No data to display Dg Hand Complete Right  01/01/2013   *RADIOLOGY REPORT*  Clinical Data: Right hand injury.  Pain worst in the ring finger.  RIGHT HAND - COMPLETE 3+ VIEW  Comparison: None.  Findings: Imaged bones, joints and soft tissues appear normal.  IMPRESSION: Negative exam.   Original Report Authenticated By: Holley Dexter,  M.D.     1. Finger sprain, initial encounter       MDM  Patients labs and/or radiological studies were viewed and considered during the medical decision making and disposition process. Pt placed in finger splint for prn comfort.  RICE,  Ibuprofen, f/u with pcp if not better over the next 10 days.        Burgess Amor, PA-C 01/01/13 1109

## 2013-01-01 NOTE — ED Notes (Signed)
Pt left without receiving discharge instructions. ?

## 2013-01-11 ENCOUNTER — Emergency Department (HOSPITAL_COMMUNITY): Payer: Self-pay

## 2013-01-11 ENCOUNTER — Emergency Department (HOSPITAL_COMMUNITY)
Admission: EM | Admit: 2013-01-11 | Discharge: 2013-01-11 | Disposition: A | Discharge status: Home / Self Care | Payer: Self-pay | Attending: Emergency Medicine | Admitting: Emergency Medicine

## 2013-01-11 ENCOUNTER — Encounter (HOSPITAL_COMMUNITY): Payer: Self-pay | Admitting: *Deleted

## 2013-01-11 DIAGNOSIS — Z872 Personal history of diseases of the skin and subcutaneous tissue: Secondary | ICD-10-CM | POA: Insufficient documentation

## 2013-01-11 DIAGNOSIS — Z3202 Encounter for pregnancy test, result negative: Secondary | ICD-10-CM | POA: Insufficient documentation

## 2013-01-11 DIAGNOSIS — R112 Nausea with vomiting, unspecified: Secondary | ICD-10-CM | POA: Insufficient documentation

## 2013-01-11 DIAGNOSIS — K8020 Calculus of gallbladder without cholecystitis without obstruction: Secondary | ICD-10-CM | POA: Insufficient documentation

## 2013-01-11 LAB — CBC WITH DIFFERENTIAL/PLATELET
Basophils Absolute: 0 10*3/uL (ref 0.0–0.1)
Basophils Relative: 0 % (ref 0–1)
Eosinophils Absolute: 0 10*3/uL (ref 0.0–0.7)
MCHC: 33.9 g/dL (ref 30.0–36.0)
Monocytes Absolute: 0.7 10*3/uL (ref 0.1–1.0)
Neutro Abs: 11.1 10*3/uL — ABNORMAL HIGH (ref 1.7–7.7)
Neutrophils Relative %: 86 % — ABNORMAL HIGH (ref 43–77)
RDW: 13.1 % (ref 11.5–15.5)

## 2013-01-11 LAB — BASIC METABOLIC PANEL
Chloride: 106 mEq/L (ref 96–112)
Creatinine, Ser: 0.5 mg/dL (ref 0.50–1.10)
GFR calc Af Amer: >90 mL/min (ref >90)
GFR calc non Af Amer: >90 mL/min (ref >90)
Potassium: 3.6 mEq/L (ref 3.5–5.1)

## 2013-01-11 LAB — HEPATIC FUNCTION PANEL
ALT: 12 U/L (ref 0–35)
Total Protein: 7.6 g/dL (ref 6.0–8.3)

## 2013-01-11 LAB — POCT PREGNANCY, URINE: Preg Test, Ur: NEGATIVE

## 2013-01-11 LAB — URINALYSIS, ROUTINE W REFLEX MICROSCOPIC
Hgb urine dipstick: NEGATIVE
Leukocytes, UA: NEGATIVE
Nitrite: NEGATIVE
Protein, ur: NEGATIVE mg/dL
Specific Gravity, Urine: >1.03 — ABNORMAL HIGH (ref 1.005–1.030)
Urobilinogen, UA: 0.2 mg/dL (ref 0.0–1.0)

## 2013-01-11 MED ORDER — PROMETHAZINE HCL 25 MG PO TABS: ORAL_TABLET | ORAL | Status: DC

## 2013-01-11 MED ORDER — HYDROCODONE-ACETAMINOPHEN 7.5-325 MG PO TABS: 1.0000 | ORAL_TABLET | ORAL | Status: DC | PRN

## 2013-01-11 MED ORDER — MORPHINE SULFATE 4 MG/ML IJ SOLN
4.0000 mg | Freq: Once | INTRAMUSCULAR | Status: AC
  Administered 2013-01-11: 4 mg via INTRAVENOUS
  Filled 2013-01-11: qty 1

## 2013-01-11 MED ORDER — SODIUM CHLORIDE 0.9 % IV SOLN
1000.0000 mL | Freq: Once | INTRAVENOUS | Status: AC
  Administered 2013-01-11: 1000 mL via INTRAVENOUS

## 2013-01-11 MED ORDER — SODIUM CHLORIDE 0.9 % IV SOLN
1000.0000 mL | INTRAVENOUS | Status: DC
  Administered 2013-01-11: 1000 mL via INTRAVENOUS

## 2013-01-11 MED ORDER — ONDANSETRON HCL 4 MG/2ML IJ SOLN
4.0000 mg | Freq: Once | INTRAMUSCULAR | Status: AC
  Administered 2013-01-11: 4 mg via INTRAVENOUS
  Filled 2013-01-11: qty 2

## 2013-01-11 NOTE — ED Provider Notes (Signed)
History     CSN: [id number]  Arrival date & time 01/11/13  1478   First MD Initiated Contact with Patient 01/11/13 [phone]      Chief Complaint  Patient presents with  . Abdominal Pain  . Emesis    (Consider location/radiation/quality/duration/timing/severity/associated sxs/prior treatment) Patient is a 22 y.o. female presenting with abdominal pain and vomiting. The history is provided by the patient.  Abdominal Pain This is a new problem. The current episode started today. Associated symptoms include abdominal pain, nausea and vomiting. Pertinent negatives include no arthralgias, chest pain, coughing, fever, myalgias, neck pain, rash, sore throat or urinary symptoms. Associated symptoms comments: Vomiting x 15 since 3am.. The symptoms are aggravated by eating and walking (a different rice to eat.). She has tried nothing for the symptoms. The treatment provided no relief.  Emesis Associated symptoms: abdominal pain   Associated symptoms: no arthralgias, no myalgias and no sore throat     Past Medical History  Diagnosis Date  . Herpes     History reviewed. No pertinent past surgical history.  No family history on file.  History  Substance Use Topics  . Smoking status: Never Smoker   . Smokeless tobacco: Not on file  . Alcohol Use: No    OB History   Grav Para Term Preterm Abortions TAB SAB Ect Mult Living                  Review of Systems  Constitutional: Negative for fever and activity change.       All ROS Neg except as noted in HPI  HENT: Negative for nosebleeds, sore throat and neck pain.   Eyes: Negative for photophobia and discharge.  Respiratory: Negative for cough, shortness of breath and wheezing.   Cardiovascular: Negative for chest pain and palpitations.  Gastrointestinal: Positive for nausea, vomiting and abdominal pain. Negative for blood in stool.  Genitourinary: Negative for dysuria, frequency and hematuria.  Musculoskeletal: Negative for myalgias,  back pain and arthralgias.  Skin: Negative.  Negative for rash.  Neurological: Negative for dizziness, seizures and speech difficulty.  Psychiatric/Behavioral: Negative for hallucinations and confusion.    Allergies  Review of patient's allergies indicates no known allergies.  Home Medications   Current Outpatient Rx  Name  Route  Sig  Dispense  Refill  . ibuprofen (ADVIL,MOTRIN) 600 MG tablet   Oral   Take 1 tablet (600 mg total) by mouth every 6 (six) hours as needed for pain.   20 tablet   0     BP 124/88  Pulse 98  Temp(Src) 98 F (36.7 C) (Oral)  Resp 16  Ht 5\' 4"  (1.626 m)  Wt 145 lb (65.772 kg)  BMI 24.88 kg/m2  SpO2 98%  Physical Exam  Nursing note and vitals reviewed. Constitutional: She is oriented to person, place, and time. She appears well-developed and well-nourished.  Non-toxic appearance.  HENT:  Head: Normocephalic.  Right Ear: Tympanic membrane and external ear normal.  Left Ear: Tympanic membrane and external ear normal.  Eyes: EOM and lids are normal. Pupils are equal, round, and reactive to light.  Neck: Normal range of motion. Neck supple. Carotid bruit is not present.  Cardiovascular: Normal rate, regular rhythm, normal heart sounds, intact distal pulses and normal pulses.   Pulmonary/Chest: Breath sounds normal. No respiratory distress.  Abdominal: Soft. There is tenderness in the periumbilical area. There is no guarding and no tenderness at McBurney's point.  Musculoskeletal: Normal range of motion.  Lymphadenopathy:  Head (right side): No submandibular adenopathy present.       Head (left side): No submandibular adenopathy present.    She has no cervical adenopathy.  Neurological: She is alert and oriented to person, place, and time. She has normal strength. No cranial nerve deficit or sensory deficit.  Skin: Skin is warm and dry.  Psychiatric: She has a normal mood and affect. Her speech is normal.    ED Course  Procedures  (including critical care time)  Labs Reviewed - No data to display No results found.   No diagnosis found. PUlse ox 98% on room air.   MDM  *I have reviewed nursing notes, vital signs, and all appropriate lab and imaging results for this patient.** Pt presents to ED with abd pain, vomiting and diarrhea since 3am. No fever, but some cramping pain.Pregnancy test negative. UA neg for infection. CBC wbc elevated 13,000. Neut. Elevated at 86, o/w negative. bmet- wnl. Lipase 15, wnl. Ultrasound of the abd reveals cholelithiasis no biliary dilatation.  Pt referred tosurgery, Rx for promethazine and norco given to the patient.       Kathie Dike, PA-C 01/26/13 1114  Medical screening examination/treatment/procedure(s) were conducted as a shared visit with non-physician practitioner(s) and myself.  I personally evaluated the patient during the encounter.   No acute abdomen. Refer to general surgery.  Donnetta Hutching, MD 01/31/13 [phone]

## 2013-01-11 NOTE — ED Notes (Signed)
Pt states generalized abdominal pain, vomiting and diarrhea began at 0300, Pain descirbed as intermittent sharp, cramping pains.

## 2013-04-26 ENCOUNTER — Encounter (HOSPITAL_COMMUNITY): Payer: Self-pay | Admitting: *Deleted

## 2013-04-26 ENCOUNTER — Emergency Department (HOSPITAL_COMMUNITY)
Admission: EM | Admit: 2013-04-26 | Discharge: 2013-04-26 | Disposition: A | Discharge status: Home / Self Care | Payer: Medicaid Other | Attending: Emergency Medicine | Admitting: Emergency Medicine

## 2013-04-26 DIAGNOSIS — K5289 Other specified noninfective gastroenteritis and colitis: Secondary | ICD-10-CM | POA: Insufficient documentation

## 2013-04-26 DIAGNOSIS — K529 Noninfective gastroenteritis and colitis, unspecified: Secondary | ICD-10-CM

## 2013-04-26 DIAGNOSIS — Z8619 Personal history of other infectious and parasitic diseases: Secondary | ICD-10-CM | POA: Insufficient documentation

## 2013-04-26 DIAGNOSIS — Z79899 Other long term (current) drug therapy: Secondary | ICD-10-CM | POA: Insufficient documentation

## 2013-04-26 LAB — BASIC METABOLIC PANEL
BUN: 11 mg/dL (ref 6–23)
Calcium: 9.5 mg/dL (ref 8.4–10.5)
Chloride: 103 mEq/L (ref 96–112)
Creatinine, Ser: 0.54 mg/dL (ref 0.50–1.10)
GFR calc Af Amer: >90 mL/min (ref >90)
GFR calc non Af Amer: >90 mL/min (ref >90)

## 2013-04-26 LAB — CBC WITH DIFFERENTIAL/PLATELET
Basophils Absolute: 0 10*3/uL (ref 0.0–0.1)
Basophils Relative: 0 % (ref 0–1)
Lymphocytes Relative: 33 % (ref 12–46)
MCHC: 33.7 g/dL (ref 30.0–36.0)
Neutro Abs: 4.8 10*3/uL (ref 1.7–7.7)
Platelets: 245 10*3/uL (ref 150–400)
RDW: 13 % (ref 11.5–15.5)
WBC: 8 10*3/uL (ref 4.0–10.5)

## 2013-04-26 LAB — LIPASE, BLOOD: Lipase: 15 U/L (ref 11–59)

## 2013-04-26 LAB — AMYLASE: Amylase: 30 U/L (ref 0–105)

## 2013-04-26 MED ORDER — ONDANSETRON HCL 4 MG PO TABS: 4.0000 mg | ORAL_TABLET | Freq: Four times a day (QID) | ORAL | Status: DC

## 2013-04-26 MED ORDER — DICYCLOMINE HCL 20 MG PO TABS: 20.0000 mg | ORAL_TABLET | Freq: Two times a day (BID) | ORAL | Status: DC

## 2013-04-26 MED ORDER — PROMETHAZINE HCL 25 MG/ML IJ SOLN
25.0000 mg | Freq: Once | INTRAMUSCULAR | Status: AC
  Administered 2013-04-26: 25 mg via INTRAMUSCULAR
  Filled 2013-04-26: qty 1

## 2013-04-26 MED ORDER — MORPHINE SULFATE 4 MG/ML IJ SOLN
8.0000 mg | Freq: Once | INTRAMUSCULAR | Status: AC
  Administered 2013-04-26: 8 mg via INTRAMUSCULAR
  Filled 2013-04-26: qty 2

## 2013-04-26 MED ORDER — DIPHENOXYLATE-ATROPINE 2.5-0.025 MG PO TABS: 1.0000 | ORAL_TABLET | Freq: Four times a day (QID) | ORAL | Status: DC | PRN

## 2013-04-26 NOTE — ED Notes (Signed)
LUQ pain , onset this am,  Vomiting, Barron Alvine. Says she  Has a gall stone.

## 2013-04-26 NOTE — ED Notes (Signed)
Pt left without receiving discharge paperwork.

## 2013-04-26 NOTE — ED Provider Notes (Signed)
CSN: [id number]     Arrival date & time 04/26/13  1531 History  This chart was scribed for Roney Marion, MD by Arlan Organ, ED Scribe. This patient was seen in room APAH6/APAH6 and the patient's care was started 5:38 PM.    Chief Complaint  Patient presents with  . Abdominal Pain   The history is provided by the patient. No language interpreter was used.   HPI Comments: Stacy Wall is a 22 y.o. female who presents to the Emergency Department complaining of epigastric abdominal pain  that started this morning. Pt also reports associated emesis and diarrhea. Pt states the emesis, diarrhea, and pain woke her from sleep. Pt describes the pain as contracting, and rates the pain 9/10.  Pt states she had multiple episodes of emesis with some hematemesis. She states she believes she may have a gallstone.   Past Medical History  Diagnosis Date  . Herpes    History reviewed. No pertinent past surgical history. History reviewed. No pertinent family history. History  Substance Use Topics  . Smoking status: Never Smoker   . Smokeless tobacco: Not on file  . Alcohol Use: No   OB History   Grav Para Term Preterm Abortions TAB SAB Ect Mult Living                 Review of Systems  Constitutional: Negative for fever, chills, diaphoresis, appetite change and fatigue.  HENT: Negative for sore throat, mouth sores and trouble swallowing.   Eyes: Negative for visual disturbance.  Respiratory: Negative for cough, chest tightness, shortness of breath and wheezing.   Cardiovascular: Negative for chest pain.  Gastrointestinal: Negative for nausea, vomiting, abdominal pain, diarrhea and abdominal distention.  Endocrine: Negative for polydipsia, polyphagia and polyuria.  Genitourinary: Negative for dysuria, frequency and hematuria.  Musculoskeletal: Negative for gait problem.  Skin: Negative for color change, pallor and rash.  Neurological: Negative for dizziness, syncope, light-headedness and  headaches.  Hematological: Does not bruise/bleed easily.  Psychiatric/Behavioral: Negative for behavioral problems and confusion.    Allergies  Hydrocodone  Home Medications   Current Outpatient Rx  Name  Route  Sig  Dispense  Refill  . dicyclomine (BENTYL) 20 MG tablet   Oral   Take 1 tablet (20 mg total) by mouth 2 (two) times daily.   20 tablet   0   . diphenoxylate-atropine (LOMOTIL) 2.5-0.025 MG per tablet   Oral   Take 1 tablet by mouth 4 (four) times daily as needed for diarrhea or loose stools.   30 tablet   0   . etonogestrel (IMPLANON) 68 MG IMPL implant   Subcutaneous   Inject 1 each into the skin once.         . ondansetron (ZOFRAN) 4 MG tablet   Oral   Take 1 tablet (4 mg total) by mouth every 6 (six) hours.   12 tablet   0    BP 123/75  Pulse 89  Temp(Src) 98.3 F (36.8 C) (Oral)  Resp 17  Ht 5\' 4"  (1.626 m)  Wt 149 lb (67.586 kg)  BMI 25.56 kg/m2  SpO2 100% Physical Exam  Constitutional: She is oriented to person, place, and time. She appears well-developed and well-nourished. No distress.  HENT:  Head: Normocephalic.  Eyes: Conjunctivae are normal. Pupils are equal, round, and reactive to light. No scleral icterus.  Neck: Normal range of motion. Neck supple. No thyromegaly present.  Cardiovascular: Normal rate and regular rhythm.  Exam reveals no  gallop and no friction rub.   No murmur heard. Pulmonary/Chest: Effort normal and breath sounds normal. No respiratory distress. She has no wheezes. She has no rales.  Abdominal: Soft. Bowel sounds are normal. She exhibits no distension. There is no tenderness. There is no rebound.  Tenderness to palpation over epigastric region No peritoneal irritation Slightly hypoactive bowel sounds  Musculoskeletal: Normal range of motion.  Neurological: She is alert and oriented to person, place, and time.  Skin: Skin is warm and dry. No rash noted.  Psychiatric: She has a normal mood and affect. Her behavior  is normal.    ED Course  Procedures (including critical care time)  DIAGNOSTIC STUDIES: Oxygen Saturation is 100% on RA, Normal by my interpretation.    COORDINATION OF CARE: 5:37 PM- Will order labs. Discussed treatment plan with pt at bedside and pt agreed to plan.     Labs Review Labs Reviewed  AMYLASE  BASIC METABOLIC PANEL  CBC WITH DIFFERENTIAL  LIPASE, BLOOD   Imaging Review No results found.  MDM   1. Gastroenteritis    Laboratory studies are negative. No abnormalities of hepatobiliary enzymes. A single dose of morphine for pain and cramps and nausea. Plan is home. Clear liquid diet. Bentyl. Zofran. Lomotil.  I personally performed the services described in this documentation, which was scribed in my presence. The recorded information has been reviewed and is accurate.    Roney Marion, MD 04/26/13 Rickey Primus

## 2013-10-19 ENCOUNTER — Emergency Department (HOSPITAL_COMMUNITY): Payer: Medicaid Other

## 2013-10-19 ENCOUNTER — Encounter (HOSPITAL_COMMUNITY): Payer: Self-pay | Admitting: Emergency Medicine

## 2013-10-19 ENCOUNTER — Emergency Department (HOSPITAL_COMMUNITY)
Admission: EM | Admit: 2013-10-19 | Discharge: 2013-10-19 | Disposition: A | Discharge status: Home / Self Care | Payer: Medicaid Other | Attending: Emergency Medicine | Admitting: Emergency Medicine

## 2013-10-19 DIAGNOSIS — S9000XA Contusion of unspecified ankle, initial encounter: Secondary | ICD-10-CM | POA: Insufficient documentation

## 2013-10-19 DIAGNOSIS — S46909A Unspecified injury of unspecified muscle, fascia and tendon at shoulder and upper arm level, unspecified arm, initial encounter: Secondary | ICD-10-CM | POA: Insufficient documentation

## 2013-10-19 DIAGNOSIS — Y9339 Activity, other involving climbing, rappelling and jumping off: Secondary | ICD-10-CM | POA: Insufficient documentation

## 2013-10-19 DIAGNOSIS — F411 Generalized anxiety disorder: Secondary | ICD-10-CM | POA: Insufficient documentation

## 2013-10-19 DIAGNOSIS — S61412A Laceration without foreign body of left hand, initial encounter: Secondary | ICD-10-CM

## 2013-10-19 DIAGNOSIS — Y92009 Unspecified place in unspecified non-institutional (private) residence as the place of occurrence of the external cause: Secondary | ICD-10-CM | POA: Insufficient documentation

## 2013-10-19 DIAGNOSIS — W268XXA Contact with other sharp object(s), not elsewhere classified, initial encounter: Secondary | ICD-10-CM | POA: Insufficient documentation

## 2013-10-19 DIAGNOSIS — S0003XA Contusion of scalp, initial encounter: Secondary | ICD-10-CM | POA: Insufficient documentation

## 2013-10-19 DIAGNOSIS — W1789XA Other fall from one level to another, initial encounter: Secondary | ICD-10-CM | POA: Insufficient documentation

## 2013-10-19 DIAGNOSIS — S0083XA Contusion of other part of head, initial encounter: Secondary | ICD-10-CM

## 2013-10-19 DIAGNOSIS — S4980XA Other specified injuries of shoulder and upper arm, unspecified arm, initial encounter: Secondary | ICD-10-CM | POA: Insufficient documentation

## 2013-10-19 DIAGNOSIS — S61409A Unspecified open wound of unspecified hand, initial encounter: Secondary | ICD-10-CM | POA: Insufficient documentation

## 2013-10-19 DIAGNOSIS — S1093XA Contusion of unspecified part of neck, initial encounter: Secondary | ICD-10-CM

## 2013-10-19 MED ORDER — LORAZEPAM 1 MG PO TABS
1.0000 mg | ORAL_TABLET | Freq: Once | ORAL | Status: AC
  Administered 2013-10-19: 1 mg via ORAL
  Filled 2013-10-19: qty 1
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
## 2013-10-19 NOTE — ED Notes (Signed)
Wound cleaned with saf-cleans.
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
## 2013-10-20 NOTE — ED Provider Notes (Signed)
Medical screening examination/treatment/procedure(s) were performed by non-physician practitioner and as supervising physician I was immediately available for consultation/collaboration.   EKG Interpretation None        Chauna Osoria L Yazmine Sorey, MD 10/20/13 1350 

## 2013-10-27 ENCOUNTER — Emergency Department (HOSPITAL_COMMUNITY)
Admission: EM | Admit: 2013-10-27 | Discharge: 2013-10-27 | Disposition: A | Discharge status: Home / Self Care | Payer: Medicaid Other | Attending: Emergency Medicine | Admitting: Emergency Medicine

## 2013-10-27 ENCOUNTER — Encounter (HOSPITAL_COMMUNITY): Payer: Self-pay | Admitting: Emergency Medicine

## 2013-10-27 DIAGNOSIS — Z8619 Personal history of other infectious and parasitic diseases: Secondary | ICD-10-CM | POA: Insufficient documentation

## 2013-10-27 DIAGNOSIS — Z4802 Encounter for removal of sutures: Secondary | ICD-10-CM | POA: Insufficient documentation

## 2013-10-27 NOTE — ED Provider Notes (Signed)
CSN: [id number]     Arrival date & time 10/27/13  1341 History   First MD Initiated Contact with Patient 10/27/13 1402     Chief Complaint  Patient presents with  . Suture / Staple Removal     (Consider location/radiation/quality/duration/timing/severity/associated sxs/prior Treatment) Patient is a 23 y.o. female presenting with suture removal. The history is provided by the patient.  Suture / Staple Removal This is a new problem. The current episode started in the past 7 days. The problem has been gradually improving. Pertinent negatives include no abdominal pain, arthralgias, chest pain, chills, coughing, fever, nausea or neck pain. Nothing aggravates the symptoms. She has tried nothing for the symptoms. The treatment provided significant relief.    Past Medical History  Diagnosis Date  . Herpes    History reviewed. No pertinent past surgical history. No family history on file. History  Substance Use Topics  . Smoking status: Never Smoker   . Smokeless tobacco: Not on file  . Alcohol Use: No   OB History   Grav Para Term Preterm Abortions TAB SAB Ect Mult Living                 Review of Systems  Constitutional: Negative for fever, chills and activity change.       All ROS Neg except as noted in HPI  HENT: Negative for nosebleeds.   Eyes: Negative for photophobia and discharge.  Respiratory: Negative for cough, shortness of breath and wheezing.   Cardiovascular: Negative for chest pain and palpitations.  Gastrointestinal: Negative for nausea, abdominal pain and blood in stool.  Genitourinary: Negative for dysuria, frequency and hematuria.  Musculoskeletal: Negative for arthralgias, back pain and neck pain.  Skin: Negative.   Neurological: Negative for dizziness, seizures and speech difficulty.  Psychiatric/Behavioral: Negative for hallucinations and confusion.      Allergies  Hydrocodone  Home Medications   Current Outpatient Rx  Name  Route  Sig  Dispense   Refill  . etonogestrel (IMPLANON) 68 MG IMPL implant   Subcutaneous   Inject 1 each into the skin once.          BP 116/69  Pulse 76  Temp(Src) 97.4 F (36.3 C) (Oral)  Resp 17  Ht 5\' 5"  (1.651 m)  Wt 145 lb (65.772 kg)  BMI 24.13 kg/m2  SpO2 100% Physical Exam  Nursing note and vitals reviewed. Constitutional: She is oriented to person, place, and time. She appears well-developed and well-nourished.  Non-toxic appearance.  HENT:  Head: Normocephalic.  Right Ear: Tympanic membrane and external ear normal.  Left Ear: Tympanic membrane and external ear normal.  Eyes: EOM and lids are normal. Pupils are equal, round, and reactive to light.  Neck: Normal range of motion. Neck supple. Carotid bruit is not present.  Cardiovascular: Normal rate, regular rhythm, normal heart sounds, intact distal pulses and normal pulses.   Pulmonary/Chest: Breath sounds normal. No respiratory distress.  Abdominal: Soft. Bowel sounds are normal. There is no tenderness. There is no guarding.  Musculoskeletal: Normal range of motion.       Hands: FROM of all fingers.  Lymphadenopathy:       Head (right side): No submandibular adenopathy present.       Head (left side): No submandibular adenopathy present.    She has no cervical adenopathy.  Neurological: She is alert and oriented to person, place, and time. She has normal strength. No cranial nerve deficit or sensory deficit.  Skin: Skin is warm and dry.  Psychiatric: She has a normal mood and affect. Her speech is normal.    ED Course  Procedures (including critical care time) Labs Review Labs Reviewed - No data to display Imaging Review No results found.   EKG Interpretation None      MDM Patient presents to the emergency room for wound check and suture removal. Wound was evaluated. No signs of infection. There is full range of motion of the fingers of the hand. The sutures were removed without problem. Patient is to return if any  problems or signs of infection.    Final diagnoses:  Visit for suture removal    **I have reviewed nursing notes, vital signs, and all appropriate lab and imaging results for this patient.Kathie Dike, PA-C 10/27/13 1413

## 2013-10-27 NOTE — ED Provider Notes (Signed)
Medical screening examination/treatment/procedure(s) were performed by non-physician practitioner and as supervising physician I was immediately available for consultation/collaboration.   EKG Interpretation None        Joya Gaskinsonald W Babacar Haycraft, MD 10/27/13 [phone]

## 2013-10-27 NOTE — Discharge Instructions (Signed)
Suture Removal, Care After Refer to this sheet in the next few weeks. These instructions provide you with information on caring for yourself after your procedure. Your health care provider may also give you more specific instructions. Your treatment has been planned according to current medical practices, but problems sometimes occur. Call your health care provider if you have any problems or questions after your procedure. WHAT TO EXPECT AFTER THE PROCEDURE After your stitches (sutures) are removed, it is typical to have the following:  Some discomfort and swelling in the wound area.  Slight redness in the area. HOME CARE INSTRUCTIONS   If you have skin adhesive strips over the wound area, do not take the strips off. They will fall off on their own in a few days. If the strips remain in place after 14 days, you may remove them.  Change any bandages (dressings) at least once a day or as directed by your health care provider. If the bandage sticks, soak it off with warm, soapy water.  Apply cream or ointment only as directed by your health care provider. If using cream or ointment, wash the area with soap and water 2 times a day to remove all the cream or ointment. Rinse off the soap and pat the area dry with a clean towel.  Keep the wound area dry and clean. If the bandage becomes wet or dirty, or if it develops a bad smell, change it as soon as possible.  Continue to protect the wound from injury.  Use sunscreen when out in the sun. New scars become sunburned easily. SEEK MEDICAL CARE IF:  You have increasing redness, swelling, or pain in the wound.  You see pus coming from the wound.  You have a fever.  You notice a bad smell coming from the wound or dressing.  Your wound breaks open (edges not staying together). Document Released: 04/09/2001 Document Revised: 05/05/2013 Document Reviewed: 02/24/2013 [employer] Patient Information 2014 [employer].  

## 2013-10-27 NOTE — ED Notes (Signed)
Here for suture removal to left hand which were placed last Tuesday.

## 2014-03-13 ENCOUNTER — Encounter (HOSPITAL_COMMUNITY): Payer: Self-pay | Admitting: Emergency Medicine

## 2014-03-13 DIAGNOSIS — Z8619 Personal history of other infectious and parasitic diseases: Secondary | ICD-10-CM | POA: Insufficient documentation

## 2014-03-13 DIAGNOSIS — Z792 Long term (current) use of antibiotics: Secondary | ICD-10-CM | POA: Insufficient documentation

## 2014-03-13 DIAGNOSIS — R300 Dysuria: Secondary | ICD-10-CM | POA: Insufficient documentation

## 2014-03-13 DIAGNOSIS — Z79899 Other long term (current) drug therapy: Secondary | ICD-10-CM | POA: Insufficient documentation

## 2014-03-13 DIAGNOSIS — Z3202 Encounter for pregnancy test, result negative: Secondary | ICD-10-CM | POA: Insufficient documentation

## 2014-03-13 DIAGNOSIS — N390 Urinary tract infection, site not specified: Secondary | ICD-10-CM | POA: Insufficient documentation

## 2014-03-13 NOTE — ED Notes (Signed)
Patient c/o urinary urgency x 3 days.

## 2014-03-14 ENCOUNTER — Emergency Department (HOSPITAL_COMMUNITY)
Admission: EM | Admit: 2014-03-14 | Discharge: 2014-03-14 | Disposition: A | Discharge status: Home / Self Care | Payer: Medicaid Other | Attending: Emergency Medicine | Admitting: Emergency Medicine

## 2014-03-14 DIAGNOSIS — N390 Urinary tract infection, site not specified: Secondary | ICD-10-CM

## 2014-03-14 LAB — URINALYSIS, ROUTINE W REFLEX MICROSCOPIC
Bilirubin Urine: NEGATIVE
GLUCOSE, UA: NEGATIVE mg/dL
Nitrite: POSITIVE — AB
PH: 6 (ref 5.0–8.0)
Protein, ur: 100 mg/dL — AB
Specific Gravity, Urine: 1.025 (ref 1.005–1.030)
Urobilinogen, UA: 1 mg/dL (ref 0.0–1.0)

## 2014-03-14 LAB — URINE MICROSCOPIC-ADD ON

## 2014-03-14 LAB — PREGNANCY, URINE: Preg Test, Ur: NEGATIVE

## 2014-03-14 MED ORDER — CEPHALEXIN 500 MG PO CAPS
500.0000 mg | ORAL_CAPSULE | Freq: Once | ORAL | Status: AC
  Administered 2014-03-14: 500 mg via ORAL
  Filled 2014-03-14: qty 1

## 2014-03-14 MED ORDER — PHENAZOPYRIDINE HCL 100 MG PO TABS
200.0000 mg | ORAL_TABLET | Freq: Once | ORAL | Status: AC
  Administered 2014-03-14: 200 mg via ORAL
  Filled 2014-03-14: qty 2

## 2014-03-14 MED ORDER — CEPHALEXIN 500 MG PO CAPS: 500.0000 mg | ORAL_CAPSULE | Freq: Two times a day (BID) | ORAL | Status: DC

## 2014-03-14 MED ORDER — PHENAZOPYRIDINE HCL 200 MG PO TABS: 200.0000 mg | ORAL_TABLET | Freq: Three times a day (TID) | ORAL | Status: DC | PRN

## 2014-03-14 NOTE — Discharge Instructions (Signed)
°  Take your antibiotics as directed and to completion. You should never have any leftover antibiotics! Push fluids and stay well hydrated.  ° °Please follow with your primary care doctor in the next 2 days for a check-up. They must obtain records for further management.  ° °Do not hesitate to return to the Emergency Department for any new, worsening or concerning symptoms.  ° °

## 2014-03-14 NOTE — ED Provider Notes (Signed)
CSN: [id number]     Arrival date & time 03/13/14  2306 History   First MD Initiated Contact with Patient 03/14/14 0013     Chief Complaint  Patient presents with  . Urinary Tract Infection     (Consider location/radiation/quality/duration/timing/severity/associated sxs/prior Treatment) HPI  Stacy Wall is a 23 y.o. female who is otherwise healthy complains of urinary frequency, urgency, pain with urination worsening over the course of 2 days. She denies any normal vaginal discharge, abdominal pain, fever, chills, nausea, vomiting, rash, concern about STDs.  Past Medical History  Diagnosis Date  . Herpes    History reviewed. No pertinent past surgical history. No family history on file. History  Substance Use Topics  . Smoking status: Never Smoker   . Smokeless tobacco: Not on file  . Alcohol Use: No   OB History   Grav Para Term Preterm Abortions TAB SAB Ect Mult Living                 Review of Systems  10 systems reviewed and found to be negative, except as noted in the HPI.  Allergies  Hydrocodone  Home Medications   Prior to Admission medications   Medication Sig Start Date End Date Taking? Authorizing Provider  cephALEXin (KEFLEX) 500 MG capsule Take 1 capsule (500 mg total) by mouth 2 (two) times daily. 03/14/14   Wendie Diskin, PA-C  etonogestrel (IMPLANON) 68 MG IMPL implant Inject 1 each into the skin once.    Historical Provider, MD  phenazopyridine (PYRIDIUM) 200 MG tablet Take 1 tablet (200 mg total) by mouth 3 (three) times daily as needed for pain. 03/14/14   Sami Roes, PA-C   BP 116/71  Pulse 64  Temp(Src) 97.8 F (36.6 C) (Oral)  Resp 20  Ht 5\' 4"  (1.626 m)  Wt 148 lb (67.132 kg)  BMI 25.39 kg/m2  SpO2 100% Physical Exam  Nursing note and vitals reviewed. Constitutional: She is oriented to person, place, and time. She appears well-developed and well-nourished. No distress.  HENT:  Head: Normocephalic and atraumatic.  Eyes:  Conjunctivae and EOM are normal. Pupils are equal, round, and reactive to light.  Neck: Normal range of motion.  Cardiovascular: Normal rate, regular rhythm and intact distal pulses.   Pulmonary/Chest: Effort normal and breath sounds normal. No stridor. No respiratory distress. She has no wheezes. She has no rales. She exhibits no tenderness.  Abdominal: Soft. Bowel sounds are normal. She exhibits no distension and no mass. There is no tenderness. There is no rebound and no guarding.  Genitourinary:  No CVA tenderness palpation bilaterally.  Musculoskeletal: Normal range of motion. She exhibits no edema and no tenderness.  Neurological: She is alert and oriented to person, place, and time.  Psychiatric: She has a normal mood and affect.    ED Course  Procedures (including critical care time) Labs Review Labs Reviewed  URINALYSIS, ROUTINE W REFLEX MICROSCOPIC - Abnormal; Notable for the following:    APPearance CLOUDY (*)    Hgb urine dipstick LARGE (*)    Ketones, ur TRACE (*)    Protein, ur 100 (*)    Nitrite POSITIVE (*)    Leukocytes, UA MODERATE (*)    All other components within normal limits  URINE MICROSCOPIC-ADD ON - Abnormal; Notable for the following:    Bacteria, UA MANY (*)    All other components within normal limits  URINE CULTURE  PREGNANCY, URINE    Imaging Review No results found.   EKG Interpretation  None      MDM   Final diagnoses:  UTI (lower urinary tract infection)    Filed Vitals:   03/13/14 2318  BP: 116/71  Pulse: 64  Temp: 97.8 F (36.6 C)  TempSrc: Oral  Resp: 20  Height: 5\' 4"  (1.626 m)  Weight: 148 lb (67.132 kg)  SpO2: 100%    Medications  cephALEXin (KEFLEX) capsule 500 mg (500 mg Oral Given 03/14/14 0105)  phenazopyridine (PYRIDIUM) tablet 200 mg (200 mg Oral Given 03/14/14 0105)    Wilfrid LundKarina G Aytes is a 23 y.o. female presenting with signs and symptoms consistent with urinary tract infection, and no concern for  pyelonephritis. No CVA tenderness, fever, nausea vomiting, vital signs stable.  Evaluation does not show pathology that would require ongoing emergent intervention or inpatient treatment. Pt is hemodynamically stable and mentating appropriately. Discussed findings and plan with patient/guardian, who agrees with care plan. All questions answered. Return precautions discussed and outpatient follow up given.   Discharge Medication List as of 03/14/2014 12:59 AM    START taking these medications   Details  cephALEXin (KEFLEX) 500 MG capsule Take 1 capsule (500 mg total) by mouth 2 (two) times daily., Starting 03/14/2014, Until Discontinued, Print    phenazopyridine (PYRIDIUM) 200 MG tablet Take 1 tablet (200 mg total) by mouth 3 (three) times daily as needed for pain., Starting 03/14/2014, Until Discontinued, Print             Wynetta Emeryicole Saquoia Sianez, PA-C 03/14/14 1554  Wynetta Emeryicole Denni France, PA-C 03/14/14 1554

## 2014-03-14 NOTE — ED Provider Notes (Signed)
Medical screening examination/treatment/procedure(s) were performed by non-physician practitioner and as supervising physician I was immediately available for consultation/collaboration.   EKG Interpretation None        Benny LennertJoseph L Kylieann Eagles, MD 03/14/14 [phone]

## 2014-03-16 LAB — URINE CULTURE: Colony Count: >=100000

## 2014-03-17 ENCOUNTER — Telehealth (HOSPITAL_BASED_OUTPATIENT_CLINIC_OR_DEPARTMENT_OTHER): Payer: Self-pay | Admitting: Emergency Medicine

## 2014-03-17 NOTE — Telephone Encounter (Signed)
Post ED Visit - Positive Culture Follow-up  Culture report reviewed by antimicrobial stewardship pharmacist: []  Wes Dulaney, Pharm.D., BCPS []  Celedonio MiyamotoJeremy Frens, [address] BCPS []  Georgina PillionElizabeth Martin, Pharm.D., BCPS []  GarfieldMinh Pham, [address]Ph[address] BCPS, AAHIVP []  Estella HuskMichelle Turner, Pharm.D., BCPS, AAHIVP []  Red ChristiansSamson Lee, Pharm.D. []  Tennis Mustassie Stewart, Pharm.D.  Positive urine culture >100,000 colonies/ml E. Coli Treated with Cephalexin 500 mg po bid x 10 days, organism sensitive to the same and no further patient follow-up is required at this time.  Berle MullMiller, Toshie Demelo 03/17/2014, 10:49 AM

## 2014-03-20 ENCOUNTER — Encounter (HOSPITAL_COMMUNITY): Payer: Self-pay | Admitting: Emergency Medicine

## 2014-03-20 ENCOUNTER — Emergency Department (HOSPITAL_COMMUNITY)
Admission: EM | Admit: 2014-03-20 | Discharge: 2014-03-20 | Disposition: A | Discharge status: Home / Self Care | Payer: Medicaid Other | Attending: Emergency Medicine | Admitting: Emergency Medicine

## 2014-03-20 DIAGNOSIS — Z792 Long term (current) use of antibiotics: Secondary | ICD-10-CM | POA: Insufficient documentation

## 2014-03-20 DIAGNOSIS — T63441A Toxic effect of venom of bees, accidental (unintentional), initial encounter: Secondary | ICD-10-CM

## 2014-03-20 DIAGNOSIS — T6391XA Toxic effect of contact with unspecified venomous animal, accidental (unintentional), initial encounter: Secondary | ICD-10-CM | POA: Insufficient documentation

## 2014-03-20 DIAGNOSIS — Z8619 Personal history of other infectious and parasitic diseases: Secondary | ICD-10-CM | POA: Insufficient documentation

## 2014-03-20 DIAGNOSIS — Y929 Unspecified place or not applicable: Secondary | ICD-10-CM | POA: Insufficient documentation

## 2014-03-20 DIAGNOSIS — T63461A Toxic effect of venom of wasps, accidental (unintentional), initial encounter: Secondary | ICD-10-CM | POA: Insufficient documentation

## 2014-03-20 DIAGNOSIS — Y939 Activity, unspecified: Secondary | ICD-10-CM | POA: Insufficient documentation

## 2014-03-20 MED ORDER — FAMOTIDINE 20 MG PO TABS: 20.0000 mg | ORAL_TABLET | Freq: Two times a day (BID) | ORAL | Status: DC

## 2014-03-20 MED ORDER — FAMOTIDINE 20 MG PO TABS
20.0000 mg | ORAL_TABLET | Freq: Once | ORAL | Status: AC
  Administered 2014-03-20: 20 mg via ORAL
  Filled 2014-03-20: qty 1

## 2014-03-20 MED ORDER — LORATADINE 10 MG PO TABS: 10.0000 mg | ORAL_TABLET | Freq: Every day | ORAL | Status: DC

## 2014-03-20 MED ORDER — DIPHENHYDRAMINE HCL 25 MG PO CAPS
25.0000 mg | ORAL_CAPSULE | Freq: Once | ORAL | Status: AC
  Administered 2014-03-20: 25 mg via ORAL
  Filled 2014-03-20: qty 1

## 2014-03-20 MED ORDER — DIPHENHYDRAMINE HCL 25 MG PO TABS: 25.0000 mg | ORAL_TABLET | Freq: Four times a day (QID) | ORAL | Status: DC

## 2014-03-20 NOTE — ED Provider Notes (Signed)
CSN: [id number]     Arrival date & time 03/20/14  1358 History   First MD Initiated Contact with Patient 03/20/14 1406     Chief Complaint  Patient presents with  . Insect Bite     (Consider location/radiation/quality/duration/timing/severity/associated sxs/prior Treatment) HPI Stacy Wall is a 23 y.o. female who presents to the ED with a bee sting to the right upper thigh that happened yesterday. She took benadryl and put tobacco on the area yesterday and the redness and swelling went down. She has taken nothing since then and today the redness and swelling has returned. She denies shortness of breath, difficulty swallowing, n/v or any other problems. She was concerned because the itching, burning and redness returned. Patient currently taking Keflex for a UTI.   Past Medical History  Diagnosis Date  . Herpes    History reviewed. No pertinent past surgical history. No family history on file. History  Substance Use Topics  . Smoking status: Never Smoker   . Smokeless tobacco: Not on file  . Alcohol Use: No   OB History   Grav Para Term Preterm Abortions TAB SAB Ect Mult Living                 Review of Systems Negative except as stated in HPI   Allergies  Hydrocodone  Home Medications   Prior to Admission medications   Medication Sig Start Date End Date Taking? Authorizing Provider  cephALEXin (KEFLEX) 500 MG capsule Take 1 capsule (500 mg total) by mouth 2 (two) times daily. 03/14/14   Nicole Pisciotta, PA-C  etonogestrel (IMPLANON) 68 MG IMPL implant Inject 1 each into the skin once.    Historical Provider, MD  phenazopyridine (PYRIDIUM) 200 MG tablet Take 1 tablet (200 mg total) by mouth 3 (three) times daily as needed for pain. 03/14/14   Nicole Pisciotta, PA-C   BP 120/88  Pulse 85  Temp(Src) 99.3 F (37.4 C) (Oral)  Resp 18  Ht  (1.626 m)  Wt 144 lb (65.318 kg)  BMI 24.71 kg/m2  SpO2 100% Physical Exam  Nursing note and vitals  reviewed. Constitutional: She is oriented to person, place, and time. She appears well-developed and well-nourished.  HENT:  Head: Normocephalic.  Mouth/Throat: Uvula is midline, oropharynx is clear and moist and mucous membranes are normal.  Eyes: Conjunctivae and EOM are normal.  Neck: Neck supple.  Cardiovascular: Normal rate and regular rhythm.   Pulmonary/Chest: Effort normal. No respiratory distress. She has no wheezes.  Abdominal: Soft. There is no tenderness.  Musculoskeletal: Normal range of motion.       Right upper leg: She exhibits swelling.       Legs: There is an area approximately 4 cm that has a tiny papular center consistent with a bee sting. There is erythema that is circular without red streaking that is consistent with a localized reaction.   Neurological: She is alert and oriented to person, place, and time. No cranial nerve deficit.  Skin: Skin is warm and dry.  Psychiatric: She has a normal mood and affect. Her behavior is normal.    ED Course  Procedures  MDM  23 y.o. female with localized reaction to bee sting from yesterday. Stable for discharge without respiratory symptoms and without difficulty swallowing or swelling of the throat. Will treat for local reaction to bee sting. Patient currently taking Keflex for a UTI and will continue. Discussed with the patient clinical findings and plan of care. All questioned fully answered.  She will return if any problems arise.    Medication List    TAKE these medications       diphenhydrAMINE 25 MG tablet  Commonly known as:  BENADRYL  Take 1 tablet (25 mg total) by mouth every 6 (six) hours.     famotidine 20 MG tablet  Commonly known as:  PEPCID  Take 1 tablet (20 mg total) by mouth 2 (two) times daily.     loratadine 10 MG tablet  Commonly known as:  CLARITIN  Take 1 tablet (10 mg total) by mouth daily.      ASK your doctor about these medications       cephALEXin 500 MG capsule  Commonly known as:   KEFLEX  Take 1 capsule (500 mg total) by mouth 2 (two) times daily.     IMPLANON 68 MG Impl implant  Generic drug:  etonogestrel  Inject 1 each into the skin once.           [address] [place], [place] 03/21/14 [phone]

## 2014-03-20 NOTE — ED Notes (Signed)
Pt c/o bee sting to right upper thigh area that happened yesterday, area is red, itching and painful,

## 2014-03-22 NOTE — ED Provider Notes (Signed)
Medical screening examination/treatment/procedure(s) were performed by non-physician practitioner and as supervising physician I was immediately available for consultation/collaboration.   EKG Interpretation None       Brent Noto R. Jermiya Reichl, MD 03/22/14 2119 

## 2016-01-18 ENCOUNTER — Emergency Department (HOSPITAL_COMMUNITY)
Admission: EM | Admit: 2016-01-18 | Discharge: 2016-01-18 | Disposition: A | Discharge status: Home / Self Care | Payer: Self-pay | Attending: Emergency Medicine | Admitting: Emergency Medicine

## 2016-01-18 ENCOUNTER — Encounter (HOSPITAL_COMMUNITY): Payer: Self-pay | Admitting: Family Medicine

## 2016-01-18 DIAGNOSIS — N898 Other specified noninflammatory disorders of vagina: Secondary | ICD-10-CM | POA: Insufficient documentation

## 2016-01-18 DIAGNOSIS — Z7251 High risk heterosexual behavior: Secondary | ICD-10-CM | POA: Insufficient documentation

## 2016-01-18 DIAGNOSIS — Z79899 Other long term (current) drug therapy: Secondary | ICD-10-CM | POA: Insufficient documentation

## 2016-01-18 DIAGNOSIS — Z202 Contact with and (suspected) exposure to infections with a predominantly sexual mode of transmission: Secondary | ICD-10-CM | POA: Insufficient documentation

## 2016-01-18 LAB — URINALYSIS, ROUTINE W REFLEX MICROSCOPIC
Bilirubin Urine: NEGATIVE
GLUCOSE, UA: NEGATIVE mg/dL
Hgb urine dipstick: NEGATIVE
KETONES UR: NEGATIVE mg/dL
NITRITE: NEGATIVE
PROTEIN: NEGATIVE mg/dL
Specific Gravity, Urine: 1.031 — ABNORMAL HIGH (ref 1.005–1.030)
pH: 6.5 (ref 5.0–8.0)

## 2016-01-18 LAB — WET PREP, GENITAL
SPERM: NONE SEEN
TRICH WET PREP: NONE SEEN
YEAST WET PREP: NONE SEEN

## 2016-01-18 LAB — URINE MICROSCOPIC-ADD ON

## 2016-01-18 LAB — PREGNANCY, URINE: Preg Test, Ur: NEGATIVE

## 2016-01-18 MED ORDER — CEFTRIAXONE SODIUM 250 MG IJ SOLR
250.0000 mg | Freq: Once | INTRAMUSCULAR | Status: AC
  Administered 2016-01-18: 250 mg via INTRAMUSCULAR
  Filled 2016-01-18: qty 250

## 2016-01-18 MED ORDER — AZITHROMYCIN 250 MG PO TABS
1000.0000 mg | ORAL_TABLET | Freq: Once | ORAL | Status: AC
  Administered 2016-01-18: 1000 mg via ORAL
  Filled 2016-01-18: qty 4

## 2016-01-18 NOTE — ED Notes (Signed)
Pt here for dark/yallow vaginal discharge with foul odor.. Denies pain.

## 2016-01-18 NOTE — ED Provider Notes (Signed)
CSN: [id number]     Arrival date & time 01/18/16  1834 History   First MD Initiated Contact with Patient 01/18/16 2026     Chief Complaint  Patient presents with  . Vaginal Discharge     (Consider location/radiation/quality/duration/timing/severity/associated sxs/prior Treatment) HPI Comments: 25 year old female with history of herpes presents to the emergency department for evaluation of vaginal discharge. Symptoms began 2 weeks ago and has been worsening since onset. Patient describes the discharge as yellow and light brown. Discharge associated with an unpleasant odor. Patient states that symptoms began after engaging in sexual intercourse without protection. She denies eating any medications for symptoms prior to arrival. She has had no fever, nausea, vomiting, abdominal pain, dysuria, or hematuria. No history of abdominal surgeries.  Patient is a 25 y.o. female presenting with vaginal discharge. The history is provided by the patient. No language interpreter was used.  Vaginal Discharge   Past Medical History  Diagnosis Date  . Herpes    History reviewed. No pertinent past surgical history. History reviewed. No pertinent family history. Social History  Substance Use Topics  . Smoking status: Never Smoker   . Smokeless tobacco: None  . Alcohol Use: No   OB History    No data available      Review of Systems  Genitourinary: Positive for vaginal discharge.  All other systems reviewed and are negative.   Allergies  Hydrocodone  Home Medications   Prior to Admission medications   Medication Sig Start Date End Date Taking? Authorizing Provider  etonogestrel (IMPLANON) 68 MG IMPL implant Inject 1 each into the skin once.   Yes Historical Provider, MD  cephALEXin (KEFLEX) 500 MG capsule Take 1 capsule (500 mg total) by mouth 2 (two) times daily. 03/14/14   Nicole Pisciotta, PA-C  diphenhydrAMINE (BENADRYL) 25 MG tablet Take 1 tablet (25 mg total) by mouth every 6 (six)  hours. 03/20/14   Hope Orlene OchM Neese, NP  famotidine (PEPCID) 20 MG tablet Take 1 tablet (20 mg total) by mouth 2 (two) times daily. 03/20/14   Hope Orlene OchM Neese, NP  loratadine (CLARITIN) 10 MG tablet Take 1 tablet (10 mg total) by mouth daily. 03/20/14   Hope Orlene OchM Neese, NP   BP 127/80 mmHg  Pulse 62  Temp(Src) 98.1 F (36.7 C)  Resp 18  SpO2 99%   Physical Exam  Constitutional: She is oriented to person, place, and time. She appears well-developed and well-nourished. No distress.  Nontoxic appearing  HENT:  Head: Normocephalic and atraumatic.  Eyes: Conjunctivae and EOM are normal. No scleral icterus.  Neck: Normal range of motion.  Cardiovascular: Normal rate, regular rhythm and intact distal pulses.   Pulmonary/Chest: Effort normal. No respiratory distress. She has no wheezes.  Respirations even and unlabored  Abdominal: Soft. She exhibits no distension. There is no tenderness. There is no rebound and no guarding.  Soft, nontender abdomen  Genitourinary: There is no rash, tenderness, lesion or injury on the right labia. There is no rash, tenderness, lesion or injury on the left labia. Uterus is not tender. Cervix exhibits friability. Cervix exhibits no motion tenderness. Right adnexum displays no mass, no tenderness and no fullness. Left adnexum displays no mass, no tenderness and no fullness. No bleeding in the vagina. Vaginal discharge (pale yellow) found.  Musculoskeletal: Normal range of motion.  Neurological: She is alert and oriented to person, place, and time. She exhibits normal muscle tone. Coordination normal.  Skin: Skin is warm and dry. No rash noted. She is  not diaphoretic. No erythema. No pallor.  Psychiatric: She has a normal mood and affect. Her behavior is normal.  Nursing note and vitals reviewed.   ED Course  Procedures (including critical care time) Labs Review Labs Reviewed  WET PREP, GENITAL - Abnormal; Notable for the following:    Clue Cells Wet Prep HPF POC PRESENT  (*)    WBC, Wet Prep HPF POC TOO NUMEROUS TO COUNT (*)    All other components within normal limits  URINALYSIS, ROUTINE W REFLEX MICROSCOPIC (NOT AT [facility]) - Abnormal; Notable for the following:    Specific Gravity, Urine 1.031 (*)    Leukocytes, UA SMALL (*)    All other components within normal limits  URINE MICROSCOPIC-ADD ON - Abnormal; Notable for the following:    Squamous Epithelial / LPF 0-5 (*)    Bacteria, UA FEW (*)    All other components within normal limits  PREGNANCY, URINE  RPR  HIV ANTIBODY (ROUTINE TESTING)  GC/CHLAMYDIA PROBE AMP ([place]) NOT AT [facility]    Imaging Review No results found. I have personally reviewed and evaluated these images and lab results as part of my medical decision-making.   EKG Interpretation None      MDM   Final diagnoses:  Potential exposure to STD  Vaginal discharge  History of unprotected sex    Patient to be discharged with instructions to follow up with the health department. Discussed importance of using protection when sexually active. Patient understands that they have GC/Chlamydia cultures pending and that they will need to inform all sexual partners if results return positive. Pt has been treated prophylacticly with azithromycin and rocephin due to pts history, pelvic exam, and wet prep with increased WBCs. Patient's symptoms today are not concerning for PID. Return precautions discussed and provided. Patient discharged in satisfactory condition with no unaddressed concerns.   Filed Vitals:   01/18/16 2030 01/18/16 2045 01/18/16 2100 01/18/16 2130  BP: 126/80 119/78 121/71 127/80  Pulse: 69 64 53 62  Temp:      Resp:      SpO2: 100% 99% 100% 99%     Antony MaduraKelly Alivea Gladson, PA-C 01/18/16 2248  Doug SouSam Jacubowitz, MD 01/19/16 [phone]

## 2016-01-18 NOTE — Discharge Instructions (Signed)
You have been treated for gonorrhea and chlamydia today. Do not engage in sexual intercourse for one week following this treatment. Notify your sexual partners of the need for them to be tested and treated for STDs. You can follow-up on the results of your STD tests through my chart or with the health department in 48 hours.  Sexually Transmitted Disease A sexually transmitted disease (STD) is a disease or infection that may be passed (transmitted) from person to person, usually during sexual activity. This may happen by way of saliva, semen, blood, vaginal mucus, or urine. Common STDs include:  Gonorrhea.  Chlamydia.  Syphilis.  HIV and AIDS.  Genital herpes.  Hepatitis B and C.  Trichomonas.  Human papillomavirus (HPV).  Pubic lice.  Scabies.  Mites.  Bacterial vaginosis. WHAT ARE CAUSES OF STDs? An STD may be caused by bacteria, a virus, or parasites. STDs are often transmitted during sexual activity if one person is infected. However, they may also be transmitted through nonsexual means. STDs may be transmitted after:   Sexual intercourse with an infected person.  Sharing sex toys with an infected person.  Sharing needles with an infected person or using unclean piercing or tattoo needles.  Having intimate contact with the genitals, mouth, or rectal areas of an infected person.  Exposure to infected fluids during birth. WHAT ARE THE SIGNS AND SYMPTOMS OF STDs? Different STDs have different symptoms. Some people may not have any symptoms. If symptoms are present, they may include:  Painful or bloody urination.  Pain in the pelvis, abdomen, vagina, anus, throat, or eyes.  A skin rash, itching, or irritation.  Growths, ulcerations, blisters, or sores in the genital and anal areas.  Abnormal vaginal discharge with or without bad odor.  Penile discharge in men.  Fever.  Pain or bleeding during sexual intercourse.  Swollen glands in the groin area.  Yellow  skin and eyes (jaundice). This is seen with hepatitis.  Swollen testicles.  Infertility.  Sores and blisters in the mouth. HOW ARE STDs DIAGNOSED? To make a diagnosis, your health care provider may:  Take a medical history.  Perform a physical exam.  Take a sample of any discharge to examine.  Swab the throat, cervix, opening to the penis, rectum, or vagina for testing.  Test a sample of your first morning urine.  Perform blood tests.  Perform a Pap test, if this applies.  Perform a colposcopy.  Perform a laparoscopy. HOW ARE STDs TREATED? Treatment depends on the STD. Some STDs may be treated but not cured.  Chlamydia, gonorrhea, trichomonas, and syphilis can be cured with antibiotic medicine.  Genital herpes, hepatitis, and HIV can be treated, but not cured, with prescribed medicines. The medicines lessen symptoms.  Genital warts from HPV can be treated with medicine or by freezing, burning (electrocautery), or surgery. Warts may come back.  HPV cannot be cured with medicine or surgery. However, abnormal areas may be removed from the cervix, vagina, or vulva.  If your diagnosis is confirmed, your recent sexual partners need treatment. This is true even if they are symptom-free or have a negative culture or evaluation. They should not have sex until their health care providers say it is okay.  Your health care provider may test you for infection again 3 months after treatment. HOW CAN I REDUCE MY RISK OF GETTING AN STD? Take these steps to reduce your risk of getting an STD:  Use latex condoms, dental dams, and water-soluble lubricants during sexual activity. Do  not use petroleum jelly or oils.  Avoid having multiple sex partners.  Do not have sex with someone who has other sex partners  Do not have sex with anyone you do not know or who is at high risk for an STD.  Avoid risky sex practices that can break your skin.  Do not have sex if you have open sores on  your mouth or skin.  Avoid drinking too much alcohol or taking illegal drugs. Alcohol and drugs can affect your judgment and put you in a vulnerable position.  Avoid engaging in oral and anal sex acts.  Get vaccinated for HPV and hepatitis. If you have not received these vaccines in the past, talk to your health care provider about whether one or both might be right for you.  If you are at risk of being infected with HIV, it is recommended that you take a prescription medicine daily to prevent HIV infection. This is called pre-exposure prophylaxis (PrEP). You are considered at risk if:  You are a man who has sex with other men (MSM).  You are a heterosexual man or woman and are sexually active with more than one partner.  You take drugs by injection.  You are sexually active with a partner who has HIV.  Talk with your health care provider about whether you are at high risk of being infected with HIV. If you choose to begin PrEP, you should first be tested for HIV. You should then be tested every 3 months for as long as you are taking PrEP. WHAT SHOULD I DO IF I THINK I HAVE AN STD?  See your health care provider.  Tell your sexual partner(s). They should be tested and treated for any STDs.  Do not have sex until your health care provider says it is okay. WHEN SHOULD I GET IMMEDIATE MEDICAL CARE? Contact your health care provider right away if:   You have severe abdominal pain.  You are a man and notice swelling or pain in your testicles.  You are a woman and notice swelling or pain in your vagina.   This information is not intended to replace advice given to you by your health care provider. Make sure you discuss any questions you have with your health care provider.   Document Released: 10/05/2002 Document Revised: 08/05/2014 Document Reviewed: 02/02/2013 [employer] Interactive Patient Education [employer]2016 [employer]

## 2016-01-19 LAB — GC/CHLAMYDIA PROBE AMP (~~LOC~~) NOT AT ARMC
Chlamydia: NEGATIVE
Neisseria Gonorrhea: NEGATIVE

## 2016-01-19 LAB — RPR: RPR Ser Ql: NONREACTIVE

## 2016-01-19 LAB — HIV ANTIBODY (ROUTINE TESTING W REFLEX): HIV Screen 4th Generation wRfx: NONREACTIVE

## 2016-02-29 ENCOUNTER — Encounter (HOSPITAL_COMMUNITY): Payer: Self-pay | Admitting: Emergency Medicine

## 2016-02-29 ENCOUNTER — Emergency Department (HOSPITAL_COMMUNITY)
Admission: EM | Admit: 2016-02-29 | Discharge: 2016-02-29 | Disposition: A | Discharge status: Home / Self Care | Payer: Self-pay | Attending: Emergency Medicine | Admitting: Emergency Medicine

## 2016-02-29 ENCOUNTER — Emergency Department (HOSPITAL_COMMUNITY): Payer: Self-pay

## 2016-02-29 DIAGNOSIS — K5900 Constipation, unspecified: Secondary | ICD-10-CM | POA: Insufficient documentation

## 2016-02-29 DIAGNOSIS — Z87898 Personal history of other specified conditions: Secondary | ICD-10-CM

## 2016-02-29 HISTORY — DX: Calculus of gallbladder without cholecystitis without obstruction: K80.20

## 2016-02-29 LAB — CBC WITH DIFFERENTIAL/PLATELET
BASOS ABS: 0 10*3/uL (ref 0.0–0.1)
BASOS PCT: 0 %
Eosinophils Absolute: 0.1 10*3/uL (ref 0.0–0.7)
Eosinophils Relative: 1 %
HCT: 41.2 % (ref 36.0–46.0)
Hemoglobin: 13.9 g/dL (ref 12.0–15.0)
Lymphocytes Relative: 33 %
Lymphs Abs: 3.2 10*3/uL (ref 0.7–4.0)
MCH: 30.3 pg (ref 26.0–34.0)
MCHC: 33.7 g/dL (ref 30.0–36.0)
MCV: 89.8 fL (ref 78.0–100.0)
MONO ABS: 0.6 10*3/uL (ref 0.1–1.0)
Monocytes Relative: 6 %
Neutro Abs: 5.9 10*3/uL (ref 1.7–7.7)
Neutrophils Relative %: 60 %
PLATELETS: 258 10*3/uL (ref 150–400)
RBC: 4.59 MIL/uL (ref 3.87–5.11)
RDW: 12.9 % (ref 11.5–15.5)
WBC: 9.7 10*3/uL (ref 4.0–10.5)

## 2016-02-29 LAB — COMPREHENSIVE METABOLIC PANEL
ALK PHOS: 62 U/L (ref 38–126)
ALT: 18 U/L (ref 14–54)
ANION GAP: 6 (ref 5–15)
AST: 18 U/L (ref 15–41)
Albumin: 4.8 g/dL (ref 3.5–5.0)
BILIRUBIN TOTAL: 0.6 mg/dL (ref 0.3–1.2)
BUN: 15 mg/dL (ref 6–20)
CALCIUM: 8.9 mg/dL (ref 8.9–10.3)
CO2: 25 mmol/L (ref 22–32)
Chloride: 104 mmol/L (ref 101–111)
Creatinine, Ser: 0.48 mg/dL (ref 0.44–1.00)
GFR calc Af Amer: >60 mL/min (ref >60)
GFR calc non Af Amer: >60 mL/min (ref >60)
GLUCOSE: 81 mg/dL (ref 65–99)
Potassium: 3.3 mmol/L — ABNORMAL LOW (ref 3.5–5.1)
Sodium: 135 mmol/L (ref 135–145)
TOTAL PROTEIN: 8.1 g/dL (ref 6.5–8.1)

## 2016-02-29 LAB — URINALYSIS, ROUTINE W REFLEX MICROSCOPIC
Bilirubin Urine: NEGATIVE
Glucose, UA: NEGATIVE mg/dL
KETONES UR: NEGATIVE mg/dL
NITRITE: NEGATIVE
PROTEIN: NEGATIVE mg/dL
Specific Gravity, Urine: 1.025 (ref 1.005–1.030)
pH: 6 (ref 5.0–8.0)

## 2016-02-29 LAB — URINE MICROSCOPIC-ADD ON

## 2016-02-29 LAB — POC OCCULT BLOOD, ED: Fecal Occult Bld: NEGATIVE

## 2016-02-29 LAB — LIPASE, BLOOD: Lipase: 14 U/L (ref 11–51)

## 2016-02-29 LAB — PREGNANCY, URINE: PREG TEST UR: NEGATIVE

## 2016-02-29 NOTE — ED Provider Notes (Signed)
[facility] Provider Note   CSN: [id number] Arrival date & time: 02/29/16  1952  First Provider Contact:  None       History   Chief Complaint Chief Complaint  Patient presents with  . Abdominal Pain    HPI Stacy Wall is a 25 y.o. female who presents to the ED with abdominal pain. She reports that she has a problem with constipation and only has a BM about every 2 or 3 days. She also reports hx of gall stones and wants to know if that can cause constipation. She denies n/v. She states that the pain is worse when she is trying to have a BM. Patient states that she [facility] her GYN check ups but just comes to the ED for everything else.   The history is provided by the patient. No language interpreter was used.  Abdominal Pain   This is a new problem. The current episode started 2 days ago. The problem has been resolved. The pain is located in the LLQ. Associated symptoms include constipation.    Past Medical History:  Diagnosis Date  . Gallstones   . Herpes     There are no active problems to display for this patient.   History reviewed. No pertinent surgical history.  OB History    No data available       Home Medications    Prior to Admission medications   Medication Sig Start Date End Date Taking? Authorizing Provider  cephALEXin (KEFLEX) 500 MG capsule Take 1 capsule (500 mg total) by mouth 2 (two) times daily. 03/14/14   Nicole Pisciotta, PA-C  diphenhydrAMINE (BENADRYL) 25 MG tablet Take 1 tablet (25 mg total) by mouth every 6 (six) hours. 03/20/14   Nathalia Wismer Orlene Och, NP  etonogestrel (IMPLANON) 68 MG IMPL implant Inject 1 each into the skin once.    Historical Provider, MD  famotidine (PEPCID) 20 MG tablet Take 1 tablet (20 mg total) by mouth 2 (two) times daily. 03/20/14   Lancelot Alyea Orlene Och, NP  loratadine (CLARITIN) 10 MG tablet Take 1 tablet (10 mg total) by mouth daily. 03/20/14   Petrina Melby Orlene Och, NP    Family  History No family history on file.  Social History Social History  Substance Use Topics  . Smoking status: Never Smoker  . Smokeless tobacco: Never Used  . Alcohol use No     Allergies   Hydrocodone   Review of Systems Review of Systems  Gastrointestinal: Positive for abdominal pain and constipation.  all other systems negative    Physical Exam Updated Vital Signs BP 127/74 (BP Location: Left Arm)   Pulse (!) 59   Temp 98.4 F (36.9 C) (Oral)   Resp 16   Ht  (1.626 m)   Wt 71.4 kg   SpO2 100%   BMI 27.02 kg/m   Physical Exam  Constitutional: She is oriented to person, place, and time. She appears well-developed and well-nourished.  Eyes: EOM are normal.  Neck: Neck supple.  Cardiovascular: Normal rate.   Pulmonary/Chest: Effort normal.  Abdominal: Soft. Bowel sounds are normal. There is no tenderness.  No pain on exam.  Musculoskeletal: Normal range of motion.  Neurological: She is alert and oriented to person, place, and time. No cranial nerve deficit.  Skin: Skin is warm and dry.  Psychiatric: She has a normal mood and affect. Her behavior is normal.  Nursing note and vitals reviewed.  ED Treatments / Results  Labs (all labs ordered are listed, but only abnormal results are displayed) Labs Reviewed  COMPREHENSIVE METABOLIC PANEL - Abnormal; Notable for the following:       Result Value   Potassium 3.3 (*)    All other components within normal limits  URINALYSIS, ROUTINE W REFLEX MICROSCOPIC (NOT AT [facility]) - Abnormal; Notable for the following:    Hgb urine dipstick SMALL (*)    Leukocytes, UA SMALL (*)    All other components within normal limits  URINE MICROSCOPIC-ADD ON - Abnormal; Notable for the following:    Squamous Epithelial / LPF 6-30 (*)    Bacteria, UA MANY (*)    All other components within normal limits  LIPASE, BLOOD  CBC WITH DIFFERENTIAL/PLATELET  PREGNANCY, URINE  POC OCCULT BLOOD, ED     Radiology Dg Abd Acute  W/chest  Result Date: 02/29/2016 CLINICAL DATA:  25 year old female with periumbilical and upper abdominal pain for 3 days. Initial encounter. EXAM: DG ABDOMEN ACUTE W/ 1V CHEST COMPARISON:  05/24/2012. FINDINGS: Normal cardiac size and mediastinal contours. Stable lung volumes. The lungs are clear. No pneumothorax or pneumoperitoneum. Non obstructed bowel gas pattern. Abdominal and pelvic visceral contours are within normal limits. No osseous abnormality identified. Small calcific foci in the bilateral pelvis likely are phleboliths. These are increased since 2013. IMPRESSION: 1.  Normal bowel gas pattern, no free air. 2. Negative chest. Electronically Signed   By: Odessa Fleming M.D.   On: 02/29/2016 21:03    Procedures Procedures (including critical care time)  Medications Ordered in ED Medications - No data to display   Initial Impression / Assessment and Plan / ED Course  I have reviewed the triage vital signs and the nursing notes.  Pertinent labs & imaging results that were available during my care of the patient were reviewed by me and considered in my medical decision making (see chart for details).  Clinical Course  labs, x-rays  Final Clinical Impressions(s) / ED Diagnoses  26 y.o. female with hx of abdominal pain that has resolved stable for d/c without acute abdomen, she does not appear to have pain. Will have patient f/u with [facility] PCP [facility] department. She will return here as needed.   Final diagnoses:  History of abdominal pain    New Prescriptions Discharge Medication List as of 02/29/2016  9:46 PM       [facility], NP 03/01/16 0045    Bethann Berkshire, MD 03/02/16 0700

## 2016-02-29 NOTE — ED Notes (Signed)
Pt returned from xray at this time.

## 2016-02-29 NOTE — ED Triage Notes (Signed)
Pt states has gallstones and has been constipated for 2 days.

## 2016-02-29 NOTE — ED Notes (Signed)
Pt to xray at this time.

## 2017-01-08 ENCOUNTER — Emergency Department (HOSPITAL_COMMUNITY)
Admission: EM | Admit: 2017-01-08 | Discharge: 2017-01-08 | Disposition: A | Discharge status: Home / Self Care | Payer: Self-pay | Attending: Emergency Medicine | Admitting: Emergency Medicine

## 2017-01-08 ENCOUNTER — Encounter (HOSPITAL_COMMUNITY): Payer: Self-pay | Admitting: *Deleted

## 2017-01-08 DIAGNOSIS — M545 Low back pain, unspecified: Secondary | ICD-10-CM

## 2017-01-08 DIAGNOSIS — G8929 Other chronic pain: Secondary | ICD-10-CM | POA: Insufficient documentation

## 2017-01-08 DIAGNOSIS — Z87891 Personal history of nicotine dependence: Secondary | ICD-10-CM | POA: Insufficient documentation

## 2017-01-08 MED ORDER — IBUPROFEN 400 MG PO TABS
400.0000 mg | ORAL_TABLET | Freq: Once | ORAL | Status: AC
Start: 2017-01-08 — End: 2017-01-08
  Administered 2017-01-08: 400 mg via ORAL
  Filled 2017-01-08: qty 1

## 2017-01-08 MED ORDER — METHOCARBAMOL 500 MG PO TABS: 500.0000 mg | ORAL_TABLET | Freq: Three times a day (TID) | ORAL | 0 refills | Status: AC | PRN

## 2017-01-08 MED ORDER — ACETAMINOPHEN 500 MG PO TABS
1000.0000 mg | ORAL_TABLET | Freq: Once | ORAL | Status: AC
  Administered 2017-01-08: 1000 mg via ORAL
  Filled 2017-01-08: qty 2

## 2017-01-08 NOTE — ED Triage Notes (Signed)
Pt comes in with lower back pain that is chronic but with a flare starting 2 days ago. Denies any injury, denies urinary problems.

## 2017-01-08 NOTE — ED Provider Notes (Signed)
[facility] Provider Note   CSN: [id number] Arrival date & time: 01/08/17  0801     History   Chief Complaint Chief Complaint  [id number] presents with  . Back Pain    HPI Stacy Wall is a 26 y.o. female presents to the ED with acute on chronic low back pain. Patient reports chronic low back pain since receiving epidurals during previous deliveries many years ago. She reports back pain flareups 2-3 times a year. Today's pain started this morning. Pain is in midline, intermittent and "sharp" and does not radiate. Pain is exacerbated by bending down, getting out of bed and direct palpation. Pain alleviated by laying flat.  Patient has not tried any medications at home for pain.  She usually just "deal with the pain" when she has back pain. No associated fevers, abdominal pain, urinary symptoms, vaginal symptoms, groin numbness, bladder/bowel incontinence or retention, focal weakness to extremities. No preceding fall, MVC or injury. No IV drug use.  Patient notes she lifted a heavy pot at work a few days ago.   HPI  Past Medical History:  Diagnosis Date  . Gallstones   . Herpes     There are no active problems to display for this patient.   History reviewed. No pertinent surgical history.  OB History    No data available       Home Medications    Prior to Admission medications   Medication Sig Start Date End Date Taking? Authorizing Provider  cephALEXin (KEFLEX) 500 MG capsule Take 1 capsule (500 mg total) by mouth 2 (two) times daily. 03/14/14   Pisciotta, Joni ReiningNicole, PA-C  diphenhydrAMINE (BENADRYL) 25 MG tablet Take 1 tablet (25 mg total) by mouth every 6 (six) hours. 03/20/14   Janne NapoleonNeese, Hope M, NP  etonogestrel (IMPLANON) 68 MG IMPL implant Inject 1 each into the skin once.    [provider]  famotidine (PEPCID) 20 MG tablet Take 1 tablet (20 mg total) by mouth 2 (two) times daily. 03/20/14   Janne NapoleonNeese, Hope M, NP  loratadine (CLARITIN) 10 MG tablet Take 1 tablet  (10 mg total) by mouth daily. 03/20/14   Janne NapoleonNeese, Hope M, NP  methocarbamol (ROBAXIN) 500 MG tablet Take 1 tablet (500 mg total) by mouth every 8 (eight) hours as needed for muscle spasms. 01/08/17 01/11/17  Liberty HandyGibbons, Dravyn Severs J, PA-C    Family History No family history on file.  Social History Social History  Substance Use Topics  . Smoking status: Never Smoker  . Smokeless tobacco: Never Used  . Alcohol use No     Allergies   Hydrocodone   Review of Systems Review of Systems  Constitutional: Negative for fever.  Gastrointestinal: Negative for abdominal pain, constipation, diarrhea, nausea and vomiting.  Genitourinary: Negative for difficulty urinating, dysuria, flank pain, frequency, hematuria, vaginal bleeding and vaginal discharge.  Musculoskeletal: Positive for back pain and myalgias. Negative for gait problem and joint swelling.  Skin: Negative for rash and wound.  Neurological: Negative for weakness and numbness.     Physical Exam Updated Vital Signs BP 116/74 (BP Location: Right Arm)   Pulse (!) 57   Temp 97.9 F (36.6 C) (Oral)   Resp 18   Ht 5\' 5"  (1.651 m)   Wt 72.6 kg (160 lb)   SpO2 98%   BMI 26.63 kg/m   Physical Exam  Constitutional: She is oriented to person, place, and time. She appears well-developed and well-nourished. No distress.  NAD.  HENT:  Head: Normocephalic and  atraumatic.  Right Ear: External ear normal.  Left Ear: External ear normal.  Nose: Nose normal.  Eyes: Conjunctivae and EOM are normal. Pupils are equal, round, and reactive to light. No scleral icterus.  Neck: Normal range of motion. Neck supple.  Cardiovascular: Normal rate, regular rhythm and normal heart sounds.   No murmur heard. Pulmonary/Chest: Effort normal and breath sounds normal. She has no wheezes.  Abdominal: Soft. Bowel sounds are normal. There is no tenderness.  No CVA or suprapubic tenderness  Musculoskeletal: Normal range of motion. She exhibits tenderness. She  exhibits no deformity.       Lumbar back: She exhibits tenderness, pain and spasm.       Back:  +Midline and bilateral lumbar spine and sacrum TTP without step offs, crepitus or overlaying skin ecchymosis, erythema, edema, warmth, abrasions or rashes +Increased muscular tone to bilateral lumbar paraspinal muscles +Pain reported with lumbar spine flexion and extension Negative SLR and faber test bilaterally Gait normal.  Full active ROM of CTL spine including flexion, extension, lateral bend and rotation. No midline or paraspinal CT spine tenderness.  SI joints and sciatic notch non tender.  Full passive hip, knee and ankle ROM bilaterally.   Neurological: She is alert and oriented to person, place, and time.  5/5 strength with hip flexion and extension, bilaterally.  5/5 strength with knee flexion and extension, bilaterally.  5/5 strength with ankle dorsiflexion and plantar flexion, bilaterally.  Sensation to light touch intact in the distribution of the obturator nerve, lateral cutaneous nerve, femoral nerve, common fibular nerve.  Feet: sensation to light touch intact in the distribution of the saphenous nerve, medial plantar nerve, lateral plantar nerve, bilaterally.   Skin: Skin is warm and dry. Capillary refill takes less than 2 seconds.  Psychiatric: She has a normal mood and affect. Her behavior is normal. Judgment and thought content normal.  Nursing note and vitals reviewed.    ED Treatments / Results  Labs (all labs ordered are listed, but only abnormal results are displayed) Labs Reviewed - No data to display  EKG  EKG Interpretation None       Radiology No results found.  Procedures Procedures (including critical care time)  Medications Ordered in ED Medications  ibuprofen (ADVIL,MOTRIN) tablet 400 mg (not administered)  acetaminophen (TYLENOL) tablet 1,000 mg (not administered)     Initial Impression / Assessment and Plan / ED Course  I have reviewed  the triage vital signs and the nursing notes.  Pertinent labs & imaging results that were available during my care of the patient were reviewed by me and considered in my medical decision making (see chart for details).    Patient is a 26 y.o. female with a hx of chronic low back pain who presents to the ED with acute on chronic low back pain that started this morning when getting out of bed.  Patient lifted a heavy pot at work prior to pain onset.   On exam pt has VSS, abdominal exam negative without guarding, rebound or rigidity.  No suprapubic or CVAT.  Musculoskeletal exam revealed midline and bilateral lumbar spine TTP with increased muscular tone consistent with muscular spasm. Negative SLR and Faber's bilaterally.  No neurological deficits appreciated. Patient is ambulatory. Initial ddx include lumbar strain, spasm and less likely ruptured disc, UTI/pyelo, PID, kidney stone, cauda equina or epidural abscess.  No red flag symptoms of back pain including: bladder/bowel retention or incontinence, night sweats, night pain, fevers or weight loss, h/o cancer,  IVDU, recent trauma. No urinary, vaginal or abdominal symptoms. No concern for cauda equina, epidural abscess, or other serious cause of back pain. Lab work or imaging not indicated today as abdominal and MSK exam reassuringl.  Suspect soft tissue/muscular etiology.  Conservative measures such as ice/heat, mild stretches, muscle relaxant and high dose NSAIDs indicated with PCP follow-up if no improvement with conservative management. ED return precautions discussed with pt who verbalized understanding and was agreeable to plan.   Final Clinical Impressions(s) / ED Diagnoses   Final diagnoses:  Chronic midline low back pain without sciatica    New Prescriptions New Prescriptions   METHOCARBAMOL (ROBAXIN) 500 MG TABLET    Take 1 tablet (500 mg total) by mouth every 8 (eight) hours as needed for muscle spasms.     Liberty Handy,  PA-C 01/08/17 0840    Samuel Jester, DO 01/11/17 1918

## 2017-01-08 NOTE — Discharge Instructions (Signed)
Your back pain is likely from a muscle or other soft tissue inflammation.   For pain, muscle spasm and inflammation: 1000 mg tylenol every 8 hours 400 mg ibuprofen every 8 hours 500 mg robaxin every 8 hours (muscle relaxer)  Take above regimen for 3 days  A heating pad at night time will also help with symptoms  Avoid any activity that worsens the pain for the next 3 days  Return to the ED for worsening back pain, abdominal pain, urinary symptoms, groin numbness, bowel or bladder incontinence or retention or weakness in your extremities

## 2017-02-23 ENCOUNTER — Encounter (HOSPITAL_COMMUNITY): Payer: Self-pay | Admitting: Emergency Medicine

## 2017-02-23 ENCOUNTER — Emergency Department (HOSPITAL_COMMUNITY)
Admission: EM | Admit: 2017-02-23 | Discharge: 2017-02-23 | Disposition: A | Discharge status: Home / Self Care | Payer: Self-pay | Attending: Emergency Medicine | Admitting: Emergency Medicine

## 2017-02-23 DIAGNOSIS — Z79899 Other long term (current) drug therapy: Secondary | ICD-10-CM | POA: Insufficient documentation

## 2017-02-23 DIAGNOSIS — L03115 Cellulitis of right lower limb: Secondary | ICD-10-CM | POA: Insufficient documentation

## 2017-02-23 DIAGNOSIS — L818 Other specified disorders of pigmentation: Secondary | ICD-10-CM | POA: Insufficient documentation

## 2017-02-23 MED ORDER — DOXYCYCLINE HYCLATE 100 MG PO TABS
100.0000 mg | ORAL_TABLET | Freq: Once | ORAL | Status: AC
  Administered 2017-02-23: 100 mg via ORAL
  Filled 2017-02-23: qty 1

## 2017-02-23 MED ORDER — DOXYCYCLINE HYCLATE 100 MG PO CAPS: 100.0000 mg | ORAL_CAPSULE | Freq: Two times a day (BID) | ORAL | 0 refills | Status: DC

## 2017-02-23 MED ORDER — BACITRACIN ZINC 500 UNIT/GM EX OINT: 1.0000 "application " | TOPICAL_OINTMENT | Freq: Two times a day (BID) | CUTANEOUS | 0 refills | Status: DC

## 2017-02-23 NOTE — ED Provider Notes (Signed)
[facility] Provider Note   CSN: [id number] Arrival date & time: 02/23/17  0214     History   Chief Complaint Chief Complaint  Patient presents with  . Leg Pain    tattoo infection    HPI Stacy Wall is a 26 y.o. female.  Patient got large tattoo to right anterior thigh 1 week ago. This was done at someone's house not a tattoo parlor. 2 days ago she had areas retouched and has developed redness swelling and pain since. She states there has been some clear drainage from the anterior part of the tattoo. Denies fever or vomiting. She has no other tattoos.    The history is provided by the patient.  Leg Pain      Past Medical History:  Diagnosis Date  . Gallstones   . Herpes     There are no active problems to display for this patient.   History reviewed. No pertinent surgical history.  OB History    No data available       Home Medications    Prior to Admission medications   Medication Sig Start Date End Date Taking? Authorizing Provider  cephALEXin (KEFLEX) 500 MG capsule Take 1 capsule (500 mg total) by mouth 2 (two) times daily. 03/14/14   Pisciotta, Joni ReiningNicole, PA-C  diphenhydrAMINE (BENADRYL) 25 MG tablet Take 1 tablet (25 mg total) by mouth every 6 (six) hours. 03/20/14   Janne NapoleonNeese, Hope M, NP  etonogestrel (IMPLANON) 68 MG IMPL implant Inject 1 each into the skin once.    [provider]  famotidine (PEPCID) 20 MG tablet Take 1 tablet (20 mg total) by mouth 2 (two) times daily. 03/20/14   Janne NapoleonNeese, Hope M, NP  loratadine (CLARITIN) 10 MG tablet Take 1 tablet (10 mg total) by mouth daily. 03/20/14   Janne NapoleonNeese, Hope M, NP    Family History No family history on file.  Social History Social History  Substance Use Topics  . Smoking status: Never Smoker  . Smokeless tobacco: Never Used  . Alcohol use No     Allergies   Hydrocodone   Review of Systems Review of Systems  Constitutional: Negative for activity change, appetite change and fever.    HENT: Negative for congestion and rhinorrhea.   Respiratory: Negative for cough, chest tightness and shortness of breath.   Cardiovascular: Negative for chest pain.  Gastrointestinal: Negative for abdominal pain, nausea and vomiting.  Genitourinary: Negative for dysuria, hematuria, vaginal bleeding and vaginal discharge.  Musculoskeletal: Negative for back pain and myalgias.  Skin: Positive for wound.  Neurological: Negative for dizziness, tremors, light-headedness and headaches.    all other systems are negative except as noted in the HPI and PMH.    Physical Exam Updated Vital Signs BP (!) 122/91 (BP Location: Left Arm)   Pulse 75   Temp 97.8 F (36.6 C) (Oral)   Resp 18   Ht 5\' 5"  (1.651 m)   Wt 74.8 kg (165 lb)   SpO2 100%   BMI 27.46 kg/m   Physical Exam  Constitutional: She is oriented to person, place, and time. She appears well-developed and well-nourished. No distress.  HENT:  Head: Normocephalic and atraumatic.  Mouth/Throat: Oropharynx is clear and moist. No oropharyngeal exudate.  Eyes: Pupils are equal, round, and reactive to light. Conjunctivae and EOM are normal.  Neck: Normal range of motion. Neck supple.  No meningismus.  Cardiovascular: Normal rate, regular rhythm, normal heart sounds and intact distal pulses.   No murmur heard.  Pulmonary/Chest: Effort normal and breath sounds normal. No respiratory distress.  Abdominal: Soft. There is no tenderness. There is no rebound and no guarding.  Musculoskeletal: Normal range of motion. She exhibits no edema or tenderness.  Neurological: She is alert and oriented to person, place, and time. No cranial nerve deficit. She exhibits normal muscle tone. Coordination normal.   5/5 strength throughout. CN 2-12 intact.Equal grip strength.   Skin: Skin is warm.  Large tattoo to anterior thigh as depicted with areas of erythema and ecchymosis. No fluctuance.   Psychiatric: She has a normal mood and affect. Her behavior is  normal.  Nursing note and vitals reviewed.        ED Treatments / Results  Labs (all labs ordered are listed, but only abnormal results are displayed) Labs Reviewed - No data to display  EKG  EKG Interpretation None       Radiology No results found.  Procedures Procedures (including critical care time)  Medications Ordered in ED Medications  doxycycline (VIBRA-TABS) tablet 100 mg (not administered)     Initial Impression / Assessment and Plan / ED Course  I have reviewed the triage vital signs and the nursing notes.  Pertinent labs & imaging results that were available during my care of the patient were reviewed by me and considered in my medical decision making (see chart for details).     Patient with possible tattoo infection with erythema and ecchymosis. There is no fluctuance. She is nontoxic-appearing and afebrile.  We'll treat with antibiotics. Advised wound check in 2 days. Return precautions discussed including worsening redness, swelling or fever or any other concerns.  Final Clinical Impressions(s) / ED Diagnoses   Final diagnoses:  Tattoos  Cellulitis of right lower extremity    New Prescriptions New Prescriptions   No medications on file     Glynn Octaveancour, Zakyah Yanes, MD 02/23/17 [phone]

## 2017-02-23 NOTE — ED Triage Notes (Signed)
Pt got original tattoo 1 week ago, had original tattoo touched up at someone's house early am on 02/22/2017, pt states the needle used to touch up the tattoo was new and clean, tattoo has spots of inflation and redness with some drainage

## 2017-02-23 NOTE — Discharge Instructions (Signed)
Take the antibiotics as prescribed. Follow up with your doctor for a wound check in 2 days. Return to the ED sooner with worsening pain, fever, spreading redness or any other concerns.

## 2017-06-21 ENCOUNTER — Encounter (HOSPITAL_COMMUNITY): Payer: Self-pay | Admitting: Cardiology

## 2017-06-21 ENCOUNTER — Emergency Department (HOSPITAL_COMMUNITY)
Admission: EM | Admit: 2017-06-21 | Discharge: 2017-06-21 | Disposition: A | Discharge status: Home / Self Care | Payer: Self-pay | Attending: Emergency Medicine | Admitting: Emergency Medicine

## 2017-06-21 DIAGNOSIS — N760 Acute vaginitis: Secondary | ICD-10-CM | POA: Insufficient documentation

## 2017-06-21 DIAGNOSIS — Z79899 Other long term (current) drug therapy: Secondary | ICD-10-CM | POA: Insufficient documentation

## 2017-06-21 DIAGNOSIS — B9689 Other specified bacterial agents as the cause of diseases classified elsewhere: Secondary | ICD-10-CM | POA: Insufficient documentation

## 2017-06-21 DIAGNOSIS — B3731 Acute candidiasis of vulva and vagina: Secondary | ICD-10-CM

## 2017-06-21 DIAGNOSIS — B373 Candidiasis of vulva and vagina: Secondary | ICD-10-CM | POA: Insufficient documentation

## 2017-06-21 LAB — URINALYSIS, ROUTINE W REFLEX MICROSCOPIC
BACTERIA UA: NONE SEEN
BILIRUBIN URINE: NEGATIVE
Glucose, UA: NEGATIVE mg/dL
HGB URINE DIPSTICK: NEGATIVE
KETONES UR: NEGATIVE mg/dL
NITRITE: NEGATIVE
Protein, ur: NEGATIVE mg/dL
Specific Gravity, Urine: 1.027 (ref 1.005–1.030)
pH: 5 (ref 5.0–8.0)

## 2017-06-21 LAB — WET PREP, GENITAL
Sperm: NONE SEEN
Trich, Wet Prep: NONE SEEN

## 2017-06-21 LAB — POC URINE PREG, ED: Preg Test, Ur: NEGATIVE

## 2017-06-21 MED ORDER — METRONIDAZOLE 500 MG PO TABS: 500.0000 mg | ORAL_TABLET | Freq: Two times a day (BID) | ORAL | 0 refills | Status: DC

## 2017-06-21 MED ORDER — FLUCONAZOLE 150 MG PO TABS: ORAL_TABLET | ORAL | 0 refills | Status: DC

## 2017-06-21 NOTE — ED Triage Notes (Signed)
Vaginal discharge and burning for 2 days.

## 2017-06-21 NOTE — ED Provider Notes (Signed)
[facility] Provider Note   CSN: [id number] Arrival date & time: 06/21/17  0920     History   Chief Complaint Chief Complaint  Patient presents with  . Vaginal Discharge    HPI Stacy Wall is a 26 y.o. female presenting with a 2-day history of vaginal discharge and burning with urination.  She endorses having a new sex partner and has had unprotected sex, also endorses they used a banana, her partner inserting in her vagina prior to the onset of symptoms.  She is concerned about STDs but also concerned about possible infection from this banana.  She denies fevers or chills, abdominal or pelvic pain, nausea or vomiting.  She has had no treatment prior to arrival.  She denies urinary frequency, urgency, hematuria or back pain.  She has had no treatments prior to arrival.  She does have a history of genital herpes, denies current outbreak.  The history is provided by the patient.    Past Medical History:  Diagnosis Date  . Gallstones   . Herpes     There are no active problems to display for this patient.   History reviewed. No pertinent surgical history.  OB History    No data available       Home Medications    Prior to Admission medications   Medication Sig Start Date End Date Taking? Authorizing Provider  etonogestrel (IMPLANON) 68 MG IMPL implant Inject 1 each into the skin once.   Yes [provider]  bacitracin ointment Apply 1 application topically 2 (two) times daily. Patient not taking: Reported on 06/21/2017 02/23/17   Glynn Octaveancour, Stephen, MD  cephALEXin (KEFLEX) 500 MG capsule Take 1 capsule (500 mg total) by mouth 2 (two) times daily. Patient not taking: Reported on 06/21/2017 03/14/14   Pisciotta, Joni ReiningNicole, PA-C  diphenhydrAMINE (BENADRYL) 25 MG tablet Take 1 tablet (25 mg total) by mouth every 6 (six) hours. 03/20/14   Janne NapoleonNeese, Hope M, NP  doxycycline (VIBRAMYCIN) 100 MG capsule Take 1 capsule (100 mg total) by mouth 2 (two)  times daily. Patient not taking: Reported on 06/21/2017 02/23/17   Glynn Octaveancour, Stephen, MD  famotidine (PEPCID) 20 MG tablet Take 1 tablet (20 mg total) by mouth 2 (two) times daily. 03/20/14   Janne NapoleonNeese, Hope M, NP  fluconazole (DIFLUCAN) 150 MG tablet Take one tablet by mouth today, then one tablet on Tuesday. 06/21/17   Burgess AmorIdol, Deklen Popelka, PA-C  loratadine (CLARITIN) 10 MG tablet Take 1 tablet (10 mg total) by mouth daily. 03/20/14   Janne NapoleonNeese, Hope M, NP  metroNIDAZOLE (FLAGYL) 500 MG tablet Take 1 tablet (500 mg total) by mouth 2 (two) times daily. 06/21/17   Burgess AmorIdol, Avalynn Bowe, PA-C    Family History History reviewed. No pertinent family history.  Social History Social History   Tobacco Use  . Smoking status: Never Smoker  . Smokeless tobacco: Never Used  Substance Use Topics  . Alcohol use: No  . Drug use: No     Allergies   Hydrocodone   Review of Systems Review of Systems  Constitutional: Negative for fever.  HENT: Negative for congestion and sore throat.   Eyes: Negative.   Respiratory: Negative for chest tightness and shortness of breath.   Cardiovascular: Negative for chest pain.  Gastrointestinal: Negative for abdominal pain and nausea.  Genitourinary: Positive for dysuria, vaginal discharge and vaginal pain. Negative for flank pain, frequency and pelvic pain.  Musculoskeletal: Negative for arthralgias, joint swelling and neck pain.  Skin: Negative.  Negative for rash and wound.  Neurological: Negative for dizziness, weakness, light-headedness, numbness and headaches.  Psychiatric/Behavioral: Negative.      Physical Exam Updated Vital Signs BP 115/75 (BP Location: Left Arm)   Pulse 71   Temp 98.2 F (36.8 C) (Oral)   Resp 16   Ht 5\' 4"  (1.626 m)   Wt 70.8 kg (156 lb)   SpO2 99%   BMI 26.78 kg/m   Physical Exam  Constitutional: She appears well-developed and well-nourished.  HENT:  Head: Normocephalic and atraumatic.  Eyes: Conjunctivae are normal.  Neck: Normal range of  motion.  Cardiovascular: Normal rate, regular rhythm, normal heart sounds and intact distal pulses.  Pulmonary/Chest: Effort normal and breath sounds normal. She has no wheezes.  Abdominal: Soft. Bowel sounds are normal. There is no tenderness.  Genitourinary: Uterus normal. Uterus is not tender. Cervix exhibits no motion tenderness, no discharge and no friability. Right adnexum displays no mass, no tenderness and no fullness. Left adnexum displays no mass, no tenderness and no fullness. No erythema or tenderness in the vagina. No signs of injury around the vagina. Vaginal discharge found.  Genitourinary Comments: White thick vaginal discharge. No rash or blisters.  Musculoskeletal: Normal range of motion.  Neurological: She is alert.  Skin: Skin is warm and dry.  Psychiatric: She has a normal mood and affect.  Nursing note and vitals reviewed.    ED Treatments / Results  Labs (all labs ordered are listed, but only abnormal results are displayed) Labs Reviewed  WET PREP, GENITAL - Abnormal; Notable for the following components:      Result Value   Yeast Wet Prep HPF POC PRESENT (*)    Clue Cells Wet Prep HPF POC PRESENT (*)    WBC, Wet Prep HPF POC MANY (*)    All other components within normal limits  URINALYSIS, ROUTINE W REFLEX MICROSCOPIC - Abnormal; Notable for the following components:   Leukocytes, UA TRACE (*)    Squamous Epithelial / LPF 0-5 (*)    All other components within normal limits  POC URINE PREG, ED  GC/CHLAMYDIA PROBE AMP ([place]) NOT AT [facility]    EKG  EKG Interpretation None       Radiology No results found.  Procedures Procedures (including critical care time)  Medications Ordered in ED Medications - No data to display   Initial Impression / Assessment and Plan / ED Course  I have reviewed the triage vital signs and the nursing notes.  Pertinent labs & imaging results that were available during my care of the patient were reviewed by me and  considered in my medical decision making (see chart for details).     Patient will be treated for yeast vaginitis and bacterial vaginosis.  Discussed that GC and chlamydia cultures are currently pending, patient is aware of their status.  Plan follow-up with her primary provider if symptoms persist.  Final Clinical Impressions(s) / ED Diagnoses   Final diagnoses:  BV (bacterial vaginosis)  Vaginal yeast infection    ED Discharge Orders        Ordered    metroNIDAZOLE (FLAGYL) 500 MG tablet  2 times daily     06/21/17 1132    fluconazole (DIFLUCAN) 150 MG tablet     06/21/17 1132       Burgess Amordol, Izel Hochberg, PA-C 06/21/17 1147    Gerhard MunchLockwood, Robert, MD 06/21/17 [phone]

## 2017-06-23 LAB — GC/CHLAMYDIA PROBE AMP (~~LOC~~) NOT AT ARMC
CHLAMYDIA, DNA PROBE: NEGATIVE
Neisseria Gonorrhea: NEGATIVE

## 2017-09-03 ENCOUNTER — Emergency Department (HOSPITAL_COMMUNITY)
Admission: EM | Admit: 2017-09-03 | Discharge: 2017-09-03 | Disposition: A | Discharge status: Home / Self Care | Payer: Self-pay | Attending: Emergency Medicine | Admitting: Emergency Medicine

## 2017-09-03 ENCOUNTER — Other Ambulatory Visit: Payer: Self-pay

## 2017-09-03 ENCOUNTER — Encounter (HOSPITAL_COMMUNITY): Payer: Self-pay | Admitting: Emergency Medicine

## 2017-09-03 DIAGNOSIS — Z79899 Other long term (current) drug therapy: Secondary | ICD-10-CM | POA: Insufficient documentation

## 2017-09-03 DIAGNOSIS — L739 Follicular disorder, unspecified: Secondary | ICD-10-CM | POA: Insufficient documentation

## 2017-09-03 MED ORDER — SULFAMETHOXAZOLE-TRIMETHOPRIM 800-160 MG PO TABS: 1.0000 | ORAL_TABLET | Freq: Two times a day (BID) | ORAL | 0 refills | Status: AC

## 2017-09-03 NOTE — ED Provider Notes (Signed)
[facility] Provider Note   CSN: [id number] Arrival date & time: 09/03/17  1026     History   Chief Complaint Chief Complaint  Patient presents with  . Abscess    HPI Stacy LundKarina G Wall is a 27 y.o. female.  Patient is a 27 year old female who presents to the emergency department with a complaint of left axilla pain.  The patient states that she shaves under her arms about every 2 days.  In the last 3 or 4 days she is noticing increasing pain of the left axilla.  She now notices a slightly raised area that is getting increasingly painful.  No red streaks noted in the arm area.  There is been no high fever reported.  Patient has no history of methicillin-resistant staph culture, and no immune compromising diagnoses.  The patient has not attempted over-the-counter antibiotic ointments or other treatments for this problem.      Past Medical History:  Diagnosis Date  . Gallstones   . Herpes     There are no active problems to display for this patient.   History reviewed. No pertinent surgical history.  OB History    No data available       Home Medications    Prior to Admission medications   Medication Sig Start Date End Date Taking? Authorizing Provider  etonogestrel (IMPLANON) 68 MG IMPL implant Inject 1 each into the skin once.   Yes [provider]  metroNIDAZOLE (FLAGYL) 500 MG tablet Take 1 tablet (500 mg total) by mouth 2 (two) times daily. Patient not taking: Reported on 09/03/2017 06/21/17   Burgess AmorIdol, Julie, PA-C    Family History History reviewed. No pertinent family history.  Social History Social History   Tobacco Use  . Smoking status: Never Smoker  . Smokeless tobacco: Never Used  Substance Use Topics  . Alcohol use: No  . Drug use: No     Allergies   Hydrocodone   Review of Systems Review of Systems  Constitutional: Negative for activity change.       All ROS Neg except as noted in HPI  HENT: Negative for  nosebleeds.   Eyes: Negative for photophobia and discharge.  Respiratory: Negative for cough, shortness of breath and wheezing.   Cardiovascular: Negative for chest pain and palpitations.  Gastrointestinal: Negative for abdominal pain and blood in stool.  Genitourinary: Negative for dysuria, frequency and hematuria.  Musculoskeletal: Negative for arthralgias, back pain and neck pain.  Skin: Negative.   Neurological: Negative for dizziness, seizures and speech difficulty.  Psychiatric/Behavioral: Negative for confusion and hallucinations.     Physical Exam Updated Vital Signs BP 110/72 (BP Location: Left Arm)   Pulse 62   Temp 98.5 F (36.9 C) (Oral)   Resp 18   Ht 5\' 4"  (1.626 m)   Wt 70.3 kg (155 lb)   SpO2 98%   BMI 26.61 kg/m   Physical Exam  Constitutional: She is oriented to person, place, and time. She appears well-developed and well-nourished.  Non-toxic appearance.  HENT:  Head: Normocephalic.  Right Ear: Tympanic membrane and external ear normal.  Left Ear: Tympanic membrane and external ear normal.  Eyes: EOM and lids are normal. Pupils are equal, round, and reactive to light.  Neck: Normal range of motion. Neck supple. Carotid bruit is not present.  Cardiovascular: Normal rate, regular rhythm, normal heart sounds, intact distal pulses and normal pulses.  Pulmonary/Chest: Breath sounds normal. No respiratory distress.  Abdominal: Soft. Bowel sounds are  normal. There is no tenderness. There is no guarding.  Musculoskeletal: Normal range of motion.  There is a slightly warm raised area of the left axilla.  There is evidence of mild folliculitis present.  No red streaks appreciated.  Radial pulses are 2+.  There is one palpable lymph node in the axilla area.  Lymphadenopathy:       Head (right side): No submandibular adenopathy present.       Head (left side): No submandibular adenopathy present.    She has no cervical adenopathy.  Neurological: She is alert and  oriented to person, place, and time. She has normal strength. No cranial nerve deficit or sensory deficit.  Skin: Skin is warm and dry.  Psychiatric: She has a normal mood and affect. Her speech is normal.  Nursing note and vitals reviewed.    ED Treatments / Results  Labs (all labs ordered are listed, but only abnormal results are displayed) Labs Reviewed - No data to display  EKG  EKG Interpretation None       Radiology No results found.  Procedures Procedures (including critical care time)  Medications Ordered in ED Medications - No data to display   Initial Impression / Assessment and Plan / ED Course  I have reviewed the triage vital signs and the nursing notes.  Pertinent labs & imaging results that were available during my care of the patient were reviewed by me and considered in my medical decision making (see chart for details).       Final Clinical Impressions(s) / ED Diagnoses MDM  Vital signs within normal limits.  Pulse oximetry is 98% on room air.  The examination is consistent with folliculitis probably related to shaving to close.  The patient is asked to use warm Epson salt soaks.  The patient will be placed on Bactrim.  She will use Tylenol or ibuprofen for soreness.  The patient is to see her primary physician or return to the emergency department if any emergent changes or problems.   Final diagnoses:  Folliculitis    ED Discharge Orders        Ordered    sulfamethoxazole-trimethoprim (BACTRIM DS,SEPTRA DS) 800-160 MG tablet  2 times daily     09/03/17 1231       Ivery Quale, PA-C 09/03/17 1232    Raeford Razor, MD 09/03/17 1244

## 2017-09-03 NOTE — ED Triage Notes (Signed)
Pt reports left axilla pain for last several days. Pt denies any known fever. nad noted.

## 2017-09-03 NOTE — ED Notes (Signed)
Called for bed placement x 1 no answer

## 2017-09-03 NOTE — Discharge Instructions (Signed)
Please be cautious not to shave to close.  Use a moisturizing antibiotic lotion or cream after shaving.  Please use warm Epson salt soaks to your left under arm over the next 5 or 6 days.  Use Bactrim 2 times daily with food.

## 2017-09-03 NOTE — ED Notes (Signed)
Small painful to touch knot noted under left axilla

## 2017-11-07 ENCOUNTER — Emergency Department (HOSPITAL_COMMUNITY)
Admission: EM | Admit: 2017-11-07 | Discharge: 2017-11-07 | Disposition: A | Discharge status: Home / Self Care | Payer: Self-pay | Attending: Emergency Medicine | Admitting: Emergency Medicine

## 2017-11-07 ENCOUNTER — Other Ambulatory Visit: Payer: Self-pay

## 2017-11-07 ENCOUNTER — Encounter (HOSPITAL_COMMUNITY): Payer: Self-pay | Admitting: Emergency Medicine

## 2017-11-07 DIAGNOSIS — Z79899 Other long term (current) drug therapy: Secondary | ICD-10-CM | POA: Insufficient documentation

## 2017-11-07 DIAGNOSIS — B9689 Other specified bacterial agents as the cause of diseases classified elsewhere: Secondary | ICD-10-CM

## 2017-11-07 DIAGNOSIS — R300 Dysuria: Secondary | ICD-10-CM

## 2017-11-07 DIAGNOSIS — N760 Acute vaginitis: Secondary | ICD-10-CM | POA: Insufficient documentation

## 2017-11-07 HISTORY — DX: Other chronic pain: G89.29

## 2017-11-07 HISTORY — DX: Dorsalgia, unspecified: M54.9

## 2017-11-07 LAB — URINALYSIS, ROUTINE W REFLEX MICROSCOPIC
Bilirubin Urine: NEGATIVE
Glucose, UA: NEGATIVE mg/dL
Hgb urine dipstick: NEGATIVE
KETONES UR: NEGATIVE mg/dL
LEUKOCYTES UA: NEGATIVE
Nitrite: NEGATIVE
PROTEIN: NEGATIVE mg/dL
Specific Gravity, Urine: 1.023 (ref 1.005–1.030)
pH: 6 (ref 5.0–8.0)

## 2017-11-07 LAB — WET PREP, GENITAL
Sperm: NONE SEEN
TRICH WET PREP: NONE SEEN
YEAST WET PREP: NONE SEEN

## 2017-11-07 MED ORDER — METRONIDAZOLE 500 MG PO TABS
500.0000 mg | ORAL_TABLET | Freq: Two times a day (BID) | ORAL | 0 refills | Status: DC
Start: 2017-11-07 — End: 2018-04-28

## 2017-11-07 NOTE — ED Provider Notes (Signed)
[facility] Provider [facility]   CSN: [id number] Arrival date & time: 11/07/17  0830     History   Chief Complaint Chief Complaint  Patient presents with  . Dysuria    HPI Stacy Wall is a 27 y.o. female.  The history is provided by the patient.  Dysuria   This is a new problem. The current episode started more than 2 days ago. The problem occurs every urination. The problem has been gradually worsening. The quality of the pain is described as burning. The pain is moderate. There has been no fever. She is sexually active. Pertinent negatives include no chills, no nausea, no vomiting, no frequency and no hematuria. She has tried nothing for the symptoms.    Past Medical History:  Diagnosis Date  . Chronic back pain   . Gallstones   . Herpes     There are no active problems to display for this patient.   History reviewed. No pertinent surgical history.   OB History   None      Home Medications    Prior to Admission medications   Medication Sig Start Date End Date Taking? Authorizing Provider  etonogestrel (IMPLANON) 68 MG IMPL implant Inject 1 each into the skin once.    [provider]  metroNIDAZOLE (FLAGYL) 500 MG tablet Take 1 tablet (500 mg total) by mouth 2 (two) times daily. Patient not taking: Reported on 09/03/2017 06/21/17   Burgess AmorIdol, Julie, PA-C    Family History No family history on file.  Social History Social History   Tobacco Use  . Smoking status: Never Smoker  . Smokeless tobacco: Never Used  Substance Use Topics  . Alcohol use: No  . Drug use: No     Allergies   Hydrocodone   Review of Systems Review of Systems  Constitutional: Negative for activity change and chills.       All ROS Neg except as noted in HPI  HENT: Negative for nosebleeds.   Eyes: Negative for photophobia and discharge.  Respiratory: Negative for cough, shortness of breath and wheezing.   Cardiovascular: Negative for chest pain and  palpitations.  Gastrointestinal: Negative for abdominal pain, blood in stool, nausea and vomiting.  Genitourinary: Positive for dysuria. Negative for frequency and hematuria.  Musculoskeletal: Negative for arthralgias, back pain and neck pain.  Skin: Negative.   Neurological: Negative for dizziness, seizures and speech difficulty.  Psychiatric/Behavioral: Negative for confusion and hallucinations.     Physical Exam Updated Vital Signs BP 113/74 (BP Location: Right Arm)   Pulse 66   Temp 97.7 F (36.5 C) (Oral)   Resp 18   Ht 5\' 5"  (1.651 m)   Wt 72.6 kg (160 lb)   SpO2 99%   BMI 26.63 kg/m   Physical Exam  Constitutional: She is oriented to person, place, and time. She appears well-developed and well-nourished.  Non-toxic appearance.  HENT:  Head: Normocephalic.  Right Ear: Tympanic membrane and external ear normal.  Left Ear: Tympanic membrane and external ear normal.  Eyes: Pupils are equal, round, and reactive to light. EOM and lids are normal.  Neck: Normal range of motion. Neck supple. Carotid bruit is not present.  Cardiovascular: Normal rate, regular rhythm, normal heart sounds, intact distal pulses and normal pulses.  Pulmonary/Chest: Breath sounds normal. No respiratory distress.  Abdominal: Soft. Bowel sounds are normal. There is no tenderness. There is no guarding.  Genitourinary:  Genitourinary Comments: Chaperone present during the examination.  There are a  few red raised areas on the inner aspect of the labia majora and labia minora.  There is a small area of increased redness at the opening of the vagina.  There is a cloudy colored discharge in the vaginal vault.  There is no foreign body present.  There is some increased redness of the cervix.  The cervix is mildly friable.  There is no wall motion tenderness.  No adnexal mass, and no adnexal tenderness.  Musculoskeletal: Normal range of motion.  Lymphadenopathy:       Head (right side): No submandibular  adenopathy present.       Head (left side): No submandibular adenopathy present.    She has no cervical adenopathy.  Neurological: She is alert and oriented to person, place, and time. She has normal strength. No cranial nerve deficit or sensory deficit.  Skin: Skin is warm and dry.  Psychiatric: She has a normal mood and affect. Her speech is normal.  Nursing note and vitals reviewed.    ED Treatments / Results  Labs (all labs ordered are listed, but only abnormal results are displayed) Labs Reviewed  URINALYSIS, ROUTINE W REFLEX MICROSCOPIC - Abnormal; Notable for the following components:      Result Value   APPearance HAZY (*)    All other components within normal limits  POC URINE PREG, ED    EKG None  Radiology No results found.  Procedures Procedures (including critical care time)  Medications Ordered in ED Medications - No data to display   Initial Impression / Assessment and Plan / ED Course  I have reviewed the triage vital signs and the nursing notes.  Pertinent labs & imaging results that were available during my care of the patient were reviewed by me and considered in my medical decision making (see chart for details).       Final Clinical Impressions(s) / ED Diagnoses MDM  Vital signs within normal limits.  The urine analysis is essentially normal.  The wet prep shows clue cells and white blood cells present consistent with a bacterial vaginosis.  There is a small area of increased irritation present.  And there also a few red raised bumps on the inner aspect of the major and minor labia.  I suspect the patient has bacterial vaginosis, and dysuria related to vaginal vault irritation.  A culture the urine will be sent to the lab.  Patient will be started on Flagyl for the bacterial vaginosis.  I have asked the patient to follow-up with the health department for additional evaluation and management.   Final diagnoses:  BV (bacterial vaginosis)  Dysuria      ED Discharge Orders        Ordered    metroNIDAZOLE (FLAGYL) 500 MG tablet  2 times daily     11/07/17 1046       Ivery Quale, PA-C 11/07/17 1053    Samuel Jester, DO 11/08/17 [phone]

## 2017-11-07 NOTE — Discharge Instructions (Addendum)
Your vital signs within normal limits.  Your wet prep shows bacterial vaginosis.  Your examination shows a small area of irritation at the very opening of your vagina.  Please use Flagyl 2 times daily with a meal.  Please do not take this medicine on an empty stomach.  Vaseline over the inflamed area of your vagina may be helpful to protect from some of the urine and other irritants.  Please refrain from sexual activity over the next 7 days.  If this burning with urination continues, please see the physicians at the local health department for additional testing.

## 2017-11-07 NOTE — ED Triage Notes (Signed)
Pt c/o of burning with urination x3 days

## 2017-11-07 NOTE — ED Notes (Signed)
Pelvic set up at bedside, pt undressed

## 2017-11-10 LAB — GC/CHLAMYDIA PROBE AMP (~~LOC~~) NOT AT ARMC
Chlamydia: NEGATIVE
Neisseria Gonorrhea: NEGATIVE

## 2018-02-12 ENCOUNTER — Encounter (HOSPITAL_COMMUNITY): Payer: Self-pay | Admitting: Emergency Medicine

## 2018-02-12 ENCOUNTER — Emergency Department (HOSPITAL_COMMUNITY)
Admission: EM | Admit: 2018-02-12 | Discharge: 2018-02-13 | Disposition: A | Discharge status: Home / Self Care | Payer: Self-pay | Attending: Emergency Medicine | Admitting: Emergency Medicine

## 2018-02-12 ENCOUNTER — Other Ambulatory Visit: Payer: Self-pay

## 2018-02-12 ENCOUNTER — Emergency Department (HOSPITAL_COMMUNITY): Payer: Self-pay

## 2018-02-12 DIAGNOSIS — R1030 Lower abdominal pain, unspecified: Secondary | ICD-10-CM

## 2018-02-12 DIAGNOSIS — Z79899 Other long term (current) drug therapy: Secondary | ICD-10-CM | POA: Insufficient documentation

## 2018-02-12 DIAGNOSIS — R102 Pelvic and perineal pain: Secondary | ICD-10-CM

## 2018-02-12 DIAGNOSIS — Z3A16 16 weeks gestation of pregnancy: Secondary | ICD-10-CM | POA: Insufficient documentation

## 2018-02-12 DIAGNOSIS — Z3490 Encounter for supervision of normal pregnancy, unspecified, unspecified trimester: Secondary | ICD-10-CM

## 2018-02-12 DIAGNOSIS — O9989 Other specified diseases and conditions complicating pregnancy, childbirth and the puerperium: Secondary | ICD-10-CM | POA: Insufficient documentation

## 2018-02-12 LAB — HCG, QUANTITATIVE, PREGNANCY: hCG, Beta Chain, Quant, S: 34285 m[IU]/mL — ABNORMAL HIGH (ref <5)

## 2018-02-12 LAB — COMPREHENSIVE METABOLIC PANEL
ALT: 24 U/L (ref 0–44)
AST: 22 U/L (ref 15–41)
Albumin: 3.3 g/dL — ABNORMAL LOW (ref 3.5–5.0)
Alkaline Phosphatase: 69 U/L (ref 38–126)
Anion gap: 7 (ref 5–15)
BILIRUBIN TOTAL: 0.2 mg/dL — AB (ref 0.3–1.2)
BUN: 6 mg/dL (ref 6–20)
CALCIUM: 9.1 mg/dL (ref 8.9–10.3)
CO2: 25 mmol/L (ref 22–32)
CREATININE: 0.4 mg/dL — AB (ref 0.44–1.00)
Chloride: 104 mmol/L (ref 98–111)
GFR calc non Af Amer: >60 mL/min (ref >60)
GLUCOSE: 98 mg/dL (ref 70–99)
Potassium: 3.8 mmol/L (ref 3.5–5.1)
SODIUM: 136 mmol/L (ref 135–145)
TOTAL PROTEIN: 6.7 g/dL (ref 6.5–8.1)

## 2018-02-12 LAB — PREGNANCY, URINE: Preg Test, Ur: POSITIVE — AB

## 2018-02-12 LAB — LIPASE, BLOOD: Lipase: 26 U/L (ref 11–51)

## 2018-02-12 LAB — URINALYSIS, ROUTINE W REFLEX MICROSCOPIC
BILIRUBIN URINE: NEGATIVE
Glucose, UA: NEGATIVE mg/dL
HGB URINE DIPSTICK: NEGATIVE
Ketones, ur: 5 mg/dL — AB
Leukocytes, UA: NEGATIVE
NITRITE: NEGATIVE
PROTEIN: NEGATIVE mg/dL
Specific Gravity, Urine: 1.023 (ref 1.005–1.030)
pH: 6 (ref 5.0–8.0)

## 2018-02-12 LAB — CBC
HCT: 36.2 % (ref 36.0–46.0)
Hemoglobin: 11.8 g/dL — ABNORMAL LOW (ref 12.0–15.0)
MCH: 29.7 pg (ref 26.0–34.0)
MCHC: 32.6 g/dL (ref 30.0–36.0)
MCV: 91.2 fL (ref 78.0–100.0)
Platelets: 253 10*3/uL (ref 150–400)
RBC: 3.97 MIL/uL (ref 3.87–5.11)
RDW: 12.9 % (ref 11.5–15.5)
WBC: 9.4 10*3/uL (ref 4.0–10.5)

## 2018-02-12 MED ORDER — PRENATAL COMPLETE 14-0.4 MG PO TABS: 1.0000 | ORAL_TABLET | Freq: Every day | ORAL | 0 refills | Status: DC

## 2018-02-12 NOTE — ED Notes (Signed)
Patient transported to Ultrasound 

## 2018-02-12 NOTE — ED Provider Notes (Signed)
Suprapubic pain x 3 days, off implennon, pregnant Declined pelvic exam No d/ch, no bleeding US pending to confirm IUP, Quant pending  12:00 patient in US now.  12:50 - US confirms IUP, 16w 1d, FHT 140. Patient updated on results. She states she has OB care available in [place] and will follow up there for prenatal care. She can be discharged home per plan of previous treatment team.    Stacy Wall, Stacy Lobue, PA-C 02/13/18 0052    [name], David, MD 02/17/18 [phone]

## 2018-02-12 NOTE — ED Provider Notes (Signed)
[facility] EMERGENCY DEPARTMENT Provider Note   CSN: [id number] Arrival date & time: 02/12/18  1756     History   Chief Complaint Chief Complaint  Patient presents with  . Abdominal Pain    HPI Stacy Wall is a 27 y.o. female with history of chronic back pain, gallstones, HSV who presents with a 3-day history of abdominal pain and low back pain.  Patient reports her pain is in the suprapubic area.  It is intermittent.  She denies any nausea, vomiting, diarrhea.  She has had infrequent, hard stools.  She reports she has a bowel movement every 2 to 3 days.  She denies any urinary symptoms, abnormal vaginal bleeding or discharge.  She reports she had her Nexplanon implant removed 4 months ago and has not had a menstrual cycle since then.  She has no concern for STD exposure.  HPI  Past Medical History:  Diagnosis Date  . Chronic back pain   . Gallstones   . Herpes     There are no active problems to display for this patient.   History reviewed. No pertinent surgical history.   OB History   None      Home Medications    Prior to Admission medications   Medication Sig Start Date End Date Taking? Authorizing Provider  etonogestrel (IMPLANON) 68 MG IMPL implant Inject 1 each into the skin once.   Yes [provider]  metroNIDAZOLE (FLAGYL) 500 MG tablet Take 1 tablet (500 mg total) by mouth 2 (two) times daily. Patient not taking: Reported on 02/12/2018 11/07/17   Ivery Quale, PA-C  Prenatal Vit-Fe Fumarate-FA (PRENATAL COMPLETE) 14-0.4 MG TABS Take 1 tablet by mouth daily. 02/12/18   Emi Holes, PA-C    Family History No family history on file.  Social History Social History   Tobacco Use  . Smoking status: Never Smoker  . Smokeless tobacco: Never Used  Substance Use Topics  . Alcohol use: No  . Drug use: No     Allergies   Hydrocodone   Review of Systems Review of Systems  Constitutional: Negative for chills and  fever.  HENT: Negative for facial swelling and sore throat.   Respiratory: Negative for shortness of breath.   Cardiovascular: Negative for chest pain.  Gastrointestinal: Positive for abdominal pain. Negative for nausea and vomiting.  Genitourinary: Negative for dysuria.  Musculoskeletal: Positive for back pain.  Skin: Negative for rash and wound.  Neurological: Negative for headaches.  Psychiatric/Behavioral: The patient is not nervous/anxious.      Physical Exam Updated Vital Signs BP 120/75   Pulse 70   Temp 98.3 F (36.8 C) (Oral)   Resp (!) 21   SpO2 100%   Physical Exam  Constitutional: She appears well-developed and well-nourished. No distress.  HENT:  Head: Normocephalic and atraumatic.  Mouth/Throat: Oropharynx is clear and moist. No oropharyngeal exudate.  Eyes: Pupils are equal, round, and reactive to light. Conjunctivae are normal. Right eye exhibits no discharge. Left eye exhibits no discharge. No scleral icterus.  Neck: Normal range of motion. Neck supple. No thyromegaly present.  Cardiovascular: Normal rate, regular rhythm, normal heart sounds and intact distal pulses. Exam reveals no gallop and no friction rub.  No murmur heard. Pulmonary/Chest: Effort normal and breath sounds normal. No stridor. No respiratory distress. She has no wheezes. She has no rales.  Abdominal: Soft. Bowel sounds are normal. She exhibits no distension. There is tenderness in the suprapubic area. There is no  rebound, no guarding and no CVA tenderness.  Genitourinary:  Genitourinary Comments: Declined  Musculoskeletal: She exhibits no edema.  Lymphadenopathy:    She has no cervical adenopathy.  Neurological: She is alert. Coordination normal.  Skin: Skin is warm and dry. No rash noted. She is not diaphoretic. No pallor.  Psychiatric: She has a normal mood and affect.  Nursing note and vitals reviewed.    ED Treatments / Results  Labs (all labs ordered are listed, but only abnormal  results are displayed) Labs Reviewed  COMPREHENSIVE METABOLIC PANEL - Abnormal; Notable for the following components:      Result Value   Creatinine, Ser 0.40 (*)    Albumin 3.3 (*)    Total Bilirubin 0.2 (*)    All other components within normal limits  CBC - Abnormal; Notable for the following components:   Hemoglobin 11.8 (*)    All other components within normal limits  URINALYSIS, ROUTINE W REFLEX MICROSCOPIC - Abnormal; Notable for the following components:   APPearance HAZY (*)    Ketones, ur 5 (*)    All other components within normal limits  PREGNANCY, URINE - Abnormal; Notable for the following components:   Preg Test, Ur POSITIVE (*)    All other components within normal limits  LIPASE, BLOOD  HCG, QUANTITATIVE, PREGNANCY  I-STAT BETA HCG BLOOD, ED ([facility], [facility], [facility] ONLY)    EKG None  Radiology No results found.  Procedures Procedures (including critical care time)  Medications Ordered in ED Medications - No data to display   Initial Impression / Assessment and Plan / ED Course  I have reviewed the triage vital signs and the nursing notes.  Pertinent labs & imaging results that were available during my care of the patient were reviewed by me and considered in my medical decision making (see chart for details).     Patient with a few day history of suprapubic pain.  She denies any urinary symptoms, nausea, vomiting, vaginal discharge or bleeding.  She is found to be pregnant with a positive urine pregnancy test.  Her UA is negative for infection.  Patient declined a pelvic exam today.  Pelvic ultrasound is pending. At shift change, patient care transferred to Elpidio AnisShari Upstill, PA-C for continued evaluation, follow up of pelvic ultrasound and determination of disposition. Anticipate discharge if pelvic ultrasound stable. Discharge home with prenatal vitamins and follow-up to OB/GYN.  Final Clinical Impressions(s) / ED Diagnoses   Final diagnoses:  Lower abdominal pain    Pregnancy, unspecified gestational age    ED Discharge Orders        Ordered    Prenatal Vit-Fe Fumarate-FA (PRENATAL COMPLETE) 14-0.4 MG TABS  Daily     02/12/18 2151       Emi HolesLaw, Brennin Durfee M, PA-C 02/12/18 2214    [name], David, MD 02/17/18 [phone]

## 2018-02-12 NOTE — ED Triage Notes (Signed)
Pt c/o lower abdominal pain x 3 days. Denies urinary symptoms/nausea/vomiting.

## 2018-02-12 NOTE — ED Provider Notes (Signed)
Patient placed in Quick Look pathway, seen and evaluated   Chief Complaint: abdominal pain  HPI:   Stacy Wall is a 27 y.o. female who presents to the ED with UTI symptoms, body aches, n/v, abdominal pain and back pain. Patient reports having UTI's in the past and this is similar but worse.  ROS: GU: frequency, dysuria  GI: n/v abdominal pain  Physical Exam:  BP 117/67   Pulse 85   Temp 98.3 F (36.8 C) (Oral)   Resp 16   SpO2 99%    Gen: No distress appears uncomfortable  Neuro: Awake and Alert  Skin: Warm and dry  Abdomen: soft tender     Initiation of care has begun. The patient has been counseled on the process, plan, and necessity for staying for the completion/evaluation, and the remainder of the medical screening examination    [name], Kailiana Granquist M, NP 02/12/18 1843    Eber HongMiller, Brian, MD 02/17/18 1134

## 2018-04-28 ENCOUNTER — Encounter: Payer: Self-pay | Admitting: Women's Health

## 2018-04-28 ENCOUNTER — Ambulatory Visit: Payer: Self-pay | Admitting: *Deleted

## 2018-04-28 ENCOUNTER — Ambulatory Visit (INDEPENDENT_AMBULATORY_CARE_PROVIDER_SITE_OTHER): Payer: Self-pay | Admitting: Women's Health

## 2018-04-28 VITALS — BP 116/71 | HR 92 | Wt 170.0 lb

## 2018-04-28 DIAGNOSIS — Z3490 Encounter for supervision of normal pregnancy, unspecified, unspecified trimester: Secondary | ICD-10-CM | POA: Insufficient documentation

## 2018-04-28 DIAGNOSIS — Z331 Pregnant state, incidental: Secondary | ICD-10-CM

## 2018-04-28 DIAGNOSIS — Z3482 Encounter for supervision of other normal pregnancy, second trimester: Secondary | ICD-10-CM

## 2018-04-28 DIAGNOSIS — Z3A26 26 weeks gestation of pregnancy: Secondary | ICD-10-CM

## 2018-04-28 DIAGNOSIS — Z363 Encounter for antenatal screening for malformations: Secondary | ICD-10-CM

## 2018-04-28 DIAGNOSIS — O0932 Supervision of pregnancy with insufficient antenatal care, second trimester: Secondary | ICD-10-CM | POA: Insufficient documentation

## 2018-04-28 DIAGNOSIS — B009 Herpesviral infection, unspecified: Secondary | ICD-10-CM

## 2018-04-28 DIAGNOSIS — O98512 Other viral diseases complicating pregnancy, second trimester: Secondary | ICD-10-CM

## 2018-04-28 DIAGNOSIS — Z1389 Encounter for screening for other disorder: Secondary | ICD-10-CM

## 2018-04-28 DIAGNOSIS — Z3402 Encounter for supervision of normal first pregnancy, second trimester: Secondary | ICD-10-CM

## 2018-04-28 LAB — POCT URINALYSIS DIPSTICK OB
Glucose, UA: NEGATIVE
Ketones, UA: NEGATIVE
LEUKOCYTES UA: NEGATIVE
NITRITE UA: NEGATIVE
PROTEIN: NEGATIVE
RBC UA: NEGATIVE

## 2018-04-28 NOTE — Progress Notes (Signed)
INITIAL OBSTETRICAL VISIT Patient name: Stacy Wall MRN [id number]  Date of birth: 1991-07-01 Chief Complaint:   Initial Prenatal Visit  History of Present Illness:   Stacy Wall is a 27 y.o. G44P2002 Hispanic female at [redacted]w[redacted]d by 16wk u/s ([facility] only) with an Estimated Date of Delivery: 07/29/18 being seen today for her initial obstetrical visit.  Had Nexplanon removed 3/4, never had a period after, so she has no idea when she actually got pregnant, was having sex regularly.  Denies any problems to date in pregnancy. Just found out about pregnancy @ 16wks, didn't have the money to come to care sooner. Her obstetrical history is significant for term uncomplicated SVB x 2.   Today she reports no complaints.  No LMP recorded. Patient is pregnant. Last pap 2018 at [facility]. Results were: normal Review of Systems:   Pertinent items are noted in HPI Denies cramping/contractions, leakage of fluid, vaginal bleeding, abnormal vaginal discharge w/ itching/odor/irritation, headaches, visual changes, shortness of breath, chest pain, abdominal pain, severe nausea/vomiting, or problems with urination or bowel movements unless otherwise stated above.  Pertinent History Reviewed:  Reviewed past medical,surgical, social, obstetrical and family history.  Reviewed problem list, medications and allergies. OB History  Gravida Para Term Preterm AB Living  3 2 2     2   SAB TAB Ectopic Multiple Live Births          2    # Outcome Date GA Lbr Len/2nd Weight Sex Delivery Anes PTL Lv  3 Current           2 Term 12/30/10 [redacted]w[redacted]d  7 lb 3 oz (3.26 kg) F Vag-Spont EPI N LIV  1 Term 08/19/09 [redacted]w[redacted]d  6 lb 3 oz (2.807 kg) M Vag-Spont EPI N LIV   Physical Assessment:   Vitals:   04/28/18 1028  BP: 116/71  Pulse: 92  Weight: 170 lb (77.1 kg)  Body mass index is 28.29 kg/m.       Physical Examination:  General appearance - well appearing, and in no distress  Mental status - alert, oriented to person, place, and  time  Psych:  She has a normal mood and affect  Skin - warm and dry, normal color, no suspicious lesions noted  Chest - effort normal, all lung fields clear to auscultation bilaterally  Heart - normal rate and regular rhythm  Abdomen - soft, nontender  Extremities:  No swelling or varicosities noted  Thin prep pap is not done  Fetal Heart Rate (bpm): 155 via doppler FH: 24cm  Results for orders placed or performed in visit on 04/28/18 (from the past 24 hour(s))  POC Urinalysis Dipstick OB   Collection Time: 04/28/18 10:48 AM  Result Value Ref Range   Color, UA     Clarity, UA     Glucose, UA Negative Negative   Bilirubin, UA     Ketones, UA neg    Spec Grav, UA     Blood, UA neg    pH, UA     POC Protein UA Negative Negative, Trace   Urobilinogen, UA     Nitrite, UA neg    Leukocytes, UA Negative Negative   Appearance     Odor      Assessment & Plan:  1) Low-Risk Pregnancy G3P2002 at [redacted]w[redacted]d with an Estimated Date of Delivery: 07/29/18   2) Initial OB visit  3) Late care  Meds: No orders of the defined types were placed in this encounter.  Initial labs obtained Continue prenatal vitamins Reviewed n/v relief measures and warning s/s to report Reviewed recommended weight gain based on pre-gravid BMI Encouraged well-balanced diet Genetic Screening discussed Quad Screen: too late Cystic fibrosis screening discussed requested Ultrasound discussed; fetal survey: requested CCNC completed>spoke w/ Jasmine  Follow-up: Return in about 1 week (around 05/05/2018) for sugar test & anatomy u/s (no visit), then 4wks for LROB.   Orders Placed This Encounter  Procedures  . GC/Chlamydia Probe Amp  . Urine Culture  . US OB Comp + 14 Wk  . Urinalysis, Routine w reflex microscopic  . Sickle cell screen  . Obstetric Panel, Including HIV  . Pain Management Screening Profile (10S)  . Cystic Fibrosis Mutation 97  . POC Urinalysis Dipstick OB    Cheral Marker CNM,  [facility] 04/28/2018 12:10 PM

## 2018-04-28 NOTE — Patient Instructions (Addendum)
Stacy Wall, I greatly value your feedback.  If you receive a survey following your visit with [place] today, we appreciate you taking the time to fill it out.  Thanks, Joellyn Haff, CNM, WHNP-BC  You will have your sugar test next visit.  Please do not eat or drink anything after midnight the night before you come, not even water.  You will be here for at least two hours.      Call the office [phone]) or go to [facility] if:  You begin to have strong, frequent contractions  Your water breaks.  Sometimes it is a big gush of fluid, sometimes it is just a trickle that keeps getting your panties wet or running down your legs  You have vaginal bleeding.  It is normal to have a small amount of spotting if your cervix was checked.   You don't feel your baby moving like normal.  If you don't, get you something to eat and drink and lay down and focus on feeling your baby move.  You should feel at least 10 movements in 2 hours.  If you don't, you should call the office or go to [facility].    Tdap Vaccine  It is recommended that you get the Tdap vaccine during the third trimester of EACH pregnancy to help protect your baby from getting pertussis (whooping cough)  27-36 weeks is the BEST time to do this so that you can pass the protection on to your baby. During pregnancy is better than after pregnancy, but if you are unable to get it during pregnancy it will be offered at the hospital.   You can get this vaccine with [place], at the health department, your family doctor, or some local pharmacies  Everyone who will be around your baby should also be up-to-date on their vaccines before the baby comes. Adults (who are not pregnant) only need 1 dose of Tdap during adulthood.   Third Trimester of Pregnancy The third trimester is from week 29 through week 42, months 7 through 9. The third trimester is a time when the fetus is growing rapidly. At the end of the ninth month, the fetus is about 20  inches in length and weighs 6-10 pounds.  BODY CHANGES Your body goes through many changes during pregnancy. The changes vary from woman to woman.   Your weight will continue to increase. You can expect to gain 25-35 pounds (11-16 kg) by the end of the pregnancy.  You may begin to get stretch marks on your hips, abdomen, and breasts.  You may urinate more often because the fetus is moving lower into your pelvis and pressing on your bladder.  You may develop or continue to have heartburn as a result of your pregnancy.  You may develop constipation because certain hormones are causing the muscles that push waste through your intestines to slow down.  You may develop hemorrhoids or swollen, bulging veins (varicose veins).  You may have pelvic pain because of the weight gain and pregnancy hormones relaxing your joints between the bones in your pelvis. Backaches may result from overexertion of the muscles supporting your posture.  You may have changes in your hair. These can include thickening of your hair, rapid growth, and changes in texture. Some women also have hair loss during or after pregnancy, or hair that feels dry or thin. Your hair will most likely return to normal after your baby is born.  Your breasts will continue to grow and be  tender. A yellow discharge may leak from your breasts called colostrum.  Your belly button may stick out.  You may feel short of breath because of your expanding uterus.  You may notice the fetus "dropping," or moving lower in your abdomen.  You may have a bloody mucus discharge. This usually occurs a few days to a week before labor begins.  Your cervix becomes thin and soft (effaced) near your due date. WHAT TO EXPECT AT YOUR PRENATAL EXAMS  You will have prenatal exams every 2 weeks until week 36. Then, you will have weekly prenatal exams. During a routine prenatal visit:  You will be weighed to make sure you and the fetus are growing  normally.  Your blood pressure is taken.  Your abdomen will be measured to track your baby's growth.  The fetal heartbeat will be listened to.  Any test results from the previous visit will be discussed.  You may have a cervical check near your due date to see if you have effaced. At around 36 weeks, your caregiver will check your cervix. At the same time, your caregiver will also perform a test on the secretions of the vaginal tissue. This test is to determine if a type of bacteria, Group B streptococcus, is present. Your caregiver will explain this further. Your caregiver may ask you:  What your birth plan is.  How you are feeling.  If you are feeling the baby move.  If you have had any abnormal symptoms, such as leaking fluid, bleeding, severe headaches, or abdominal cramping.  If you have any questions. Other tests or screenings that may be performed during your third trimester include:  Blood tests that check for low iron levels (anemia).  Fetal testing to check the health, activity level, and growth of the fetus. Testing is done if you have certain medical conditions or if there are problems during the pregnancy. FALSE LABOR You may feel small, irregular contractions that eventually go away. These are called Braxton Hicks contractions, or false labor. Contractions may last for hours, days, or even weeks before true labor sets in. If contractions come at regular intervals, intensify, or become painful, it is best to be seen by your caregiver.  SIGNS OF LABOR   Menstrual-like cramps.  Contractions that are 5 minutes apart or less.  Contractions that start on the top of the uterus and spread down to the lower abdomen and back.  A sense of increased pelvic pressure or back pain.  A watery or bloody mucus discharge that comes from the vagina. If you have any of these signs before the 37th week of pregnancy, call your caregiver right away. You need to go to the hospital to  get checked immediately. HOME CARE INSTRUCTIONS   Avoid all smoking, herbs, alcohol, and unprescribed drugs. These chemicals affect the formation and growth of the baby.  Follow your caregiver's instructions regarding medicine use. There are medicines that are either safe or unsafe to take during pregnancy.  Exercise only as directed by your caregiver. Experiencing uterine cramps is a good sign to stop exercising.  Continue to eat regular, healthy meals.  Wear a good support bra for breast tenderness.  Do not use hot tubs, steam rooms, or saunas.  Wear your seat belt at all times when driving.  Avoid raw meat, uncooked cheese, cat litter boxes, and soil used by cats. These carry germs that can cause birth defects in the baby.  Take your prenatal vitamins.  Try taking a  stool softener (if your caregiver approves) if you develop constipation. Eat more high-fiber foods, such as fresh vegetables or fruit and whole grains. Drink plenty of fluids to keep your urine clear or pale yellow.  Take warm sitz baths to soothe any pain or discomfort caused by hemorrhoids. Use hemorrhoid cream if your caregiver approves.  If you develop varicose veins, wear support hose. Elevate your feet for 15 minutes, 3-4 times a day. Limit salt in your diet.  Avoid heavy lifting, wear low heal shoes, and practice good posture.  Rest a lot with your legs elevated if you have leg cramps or low back pain.  Visit your dentist if you have not gone during your pregnancy. Use a soft toothbrush to brush your teeth and be gentle when you floss.  A sexual relationship may be continued unless your caregiver directs you otherwise.  Do not travel far distances unless it is absolutely necessary and only with the approval of your caregiver.  Take prenatal classes to understand, practice, and ask questions about the labor and delivery.  Make a trial run to the hospital.  Pack your hospital bag.  Prepare the baby's  nursery.  Continue to go to all your prenatal visits as directed by your caregiver. SEEK MEDICAL CARE IF:  You are unsure if you are in labor or if your water has broken.  You have dizziness.  You have mild pelvic cramps, pelvic pressure, or nagging pain in your abdominal area.  You have persistent nausea, vomiting, or diarrhea.  You have a bad smelling vaginal discharge.  You have pain with urination. SEEK IMMEDIATE MEDICAL CARE IF:   You have a fever.  You are leaking fluid from your vagina.  You have spotting or bleeding from your vagina.  You have severe abdominal cramping or pain.  You have rapid weight loss or gain.  You have shortness of breath with chest pain.  You notice sudden or extreme swelling of your face, hands, ankles, feet, or legs.  You have not felt your baby move in over an hour.  You have severe headaches that do not go away with medicine.  You have vision changes. Document Released: 07/09/2001 Document Revised: 07/20/2013 Document Reviewed: 09/15/2012 [facility] Patient Information 2015 [employer], [place]. This information is not intended to replace advice given to you by your health care provider. Make sure you discuss any questions you have with your health care provider.

## 2018-04-29 LAB — URINALYSIS, ROUTINE W REFLEX MICROSCOPIC
Bilirubin, UA: NEGATIVE
Glucose, UA: NEGATIVE
Ketones, UA: NEGATIVE
LEUKOCYTES UA: NEGATIVE
NITRITE UA: NEGATIVE
PH UA: 6.5 (ref 5.0–7.5)
Protein, UA: NEGATIVE
RBC, UA: NEGATIVE
Specific Gravity, UA: 1.021 (ref 1.005–1.030)
Urobilinogen, Ur: 0.2 mg/dL (ref 0.2–1.0)

## 2018-04-29 LAB — OBSTETRIC PANEL, INCLUDING HIV
ANTIBODY SCREEN: NEGATIVE
BASOS: 0 %
Basophils Absolute: 0 10*3/uL (ref 0.0–0.2)
EOS (ABSOLUTE): 0 10*3/uL (ref 0.0–0.4)
EOS: 1 %
HEMATOCRIT: 36.3 % (ref 34.0–46.6)
HEP B S AG: NEGATIVE
HIV Screen 4th Generation wRfx: NONREACTIVE
Hemoglobin: 12.6 g/dL (ref 11.1–15.9)
IMMATURE GRANULOCYTES: 1 %
Immature Grans (Abs): 0.1 10*3/uL (ref 0.0–0.1)
Lymphocytes Absolute: 2 10*3/uL (ref 0.7–3.1)
Lymphs: 24 %
MCH: 30.5 pg (ref 26.6–33.0)
MCHC: 34.7 g/dL (ref 31.5–35.7)
MCV: 88 fL (ref 79–97)
Monocytes Absolute: 0.4 10*3/uL (ref 0.1–0.9)
Monocytes: 5 %
Neutrophils Absolute: 5.9 10*3/uL (ref 1.4–7.0)
Neutrophils: 69 %
Platelets: 249 10*3/uL (ref 150–450)
RBC: 4.13 x10E6/uL (ref 3.77–5.28)
RDW: 12.9 % (ref 12.3–15.4)
RH TYPE: POSITIVE
RPR: NONREACTIVE
Rubella Antibodies, IGG: 3.94 index (ref >0.99)
WBC: 8.5 10*3/uL (ref 3.4–10.8)

## 2018-04-29 LAB — MED LIST OPTION NOT SELECTED

## 2018-04-29 LAB — SICKLE CELL SCREEN: Sickle Cell Screen: NEGATIVE

## 2018-04-30 LAB — PMP SCREEN PROFILE (10S), URINE
Amphetamine Scrn, Ur: NEGATIVE ng/mL
BARBITURATE SCREEN URINE: NEGATIVE ng/mL
BENZODIAZEPINE SCREEN, URINE: NEGATIVE ng/mL
CANNABINOIDS UR QL SCN: POSITIVE ng/mL — AB
CREATININE(CRT), U: 123.1 mg/dL (ref 20.0–300.0)
Cocaine (Metab) Scrn, Ur: NEGATIVE ng/mL
Methadone Screen, Urine: NEGATIVE ng/mL
OXYCODONE+OXYMORPHONE UR QL SCN: NEGATIVE ng/mL
Opiate Scrn, Ur: NEGATIVE ng/mL
PH UR, DRUG SCRN: 6.7 (ref 4.5–8.9)
Phencyclidine Qn, Ur: NEGATIVE ng/mL
Propoxyphene Scrn, Ur: NEGATIVE ng/mL

## 2018-04-30 LAB — GC/CHLAMYDIA PROBE AMP
Chlamydia trachomatis, NAA: NEGATIVE
Neisseria gonorrhoeae by PCR: NEGATIVE

## 2018-04-30 LAB — URINE CULTURE

## 2018-05-05 ENCOUNTER — Ambulatory Visit (INDEPENDENT_AMBULATORY_CARE_PROVIDER_SITE_OTHER): Payer: Self-pay

## 2018-05-05 ENCOUNTER — Other Ambulatory Visit (INDEPENDENT_AMBULATORY_CARE_PROVIDER_SITE_OTHER): Payer: Self-pay

## 2018-05-05 DIAGNOSIS — Z3402 Encounter for supervision of normal first pregnancy, second trimester: Secondary | ICD-10-CM

## 2018-05-05 DIAGNOSIS — O0932 Supervision of pregnancy with insufficient antenatal care, second trimester: Secondary | ICD-10-CM

## 2018-05-05 DIAGNOSIS — Z3482 Encounter for supervision of other normal pregnancy, second trimester: Secondary | ICD-10-CM

## 2018-05-05 DIAGNOSIS — Z363 Encounter for antenatal screening for malformations: Secondary | ICD-10-CM

## 2018-05-05 DIAGNOSIS — Z131 Encounter for screening for diabetes mellitus: Secondary | ICD-10-CM

## 2018-05-05 DIAGNOSIS — Z3A27 27 weeks gestation of pregnancy: Secondary | ICD-10-CM

## 2018-05-05 LAB — CYSTIC FIBROSIS MUTATION 97: Interpretation: NOT DETECTED

## 2018-05-05 NOTE — Progress Notes (Signed)
[place] 27+6 wks,cephalic,anterior placenta gr 0,cx 3.8 cm,normal ovaries bilat,afi 15 cm,fhr 146 bpm,efw 1118 g 33%,anatomy complete,no obvious abnormalities

## 2018-05-06 LAB — GLUCOSE TOLERANCE, 2 HOURS W/ 1HR
GLUCOSE, FASTING: 67 mg/dL (ref 65–91)
Glucose, 1 hour: 104 mg/dL (ref 65–179)
Glucose, 2 hour: 86 mg/dL (ref 65–152)

## 2018-05-06 LAB — CBC
HEMATOCRIT: 37.8 % (ref 34.0–46.6)
HEMOGLOBIN: 12.7 g/dL (ref 11.1–15.9)
MCH: 29.9 pg (ref 26.6–33.0)
MCHC: 33.6 g/dL (ref 31.5–35.7)
MCV: 89 fL (ref 79–97)
Platelets: 253 10*3/uL (ref 150–450)
RBC: 4.25 x10E6/uL (ref 3.77–5.28)
RDW: 12.6 % (ref 12.3–15.4)
WBC: 9.4 10*3/uL (ref 3.4–10.8)

## 2018-05-06 LAB — HIV ANTIBODY (ROUTINE TESTING W REFLEX): HIV Screen 4th Generation wRfx: NONREACTIVE

## 2018-05-06 LAB — RPR: RPR Ser Ql: NONREACTIVE

## 2018-05-06 LAB — ANTIBODY SCREEN: Antibody Screen: NEGATIVE

## 2018-05-20 ENCOUNTER — Telehealth: Payer: Self-pay | Admitting: *Deleted

## 2018-05-20 NOTE — Telephone Encounter (Signed)
Pt aware results from 05/05/18 was normal. Pt voiced understanding. JSY

## 2018-05-26 ENCOUNTER — Encounter: Payer: Self-pay | Admitting: Obstetrics & Gynecology

## 2018-06-06 ENCOUNTER — Other Ambulatory Visit: Payer: Self-pay

## 2018-06-06 ENCOUNTER — Encounter (HOSPITAL_COMMUNITY): Payer: Self-pay | Admitting: Emergency Medicine

## 2018-06-06 ENCOUNTER — Emergency Department (HOSPITAL_COMMUNITY)
Admission: EM | Admit: 2018-06-06 | Discharge: 2018-06-06 | Disposition: A | Discharge status: Home / Self Care | Payer: Self-pay | Attending: Emergency Medicine | Admitting: Emergency Medicine

## 2018-06-06 DIAGNOSIS — Z79899 Other long term (current) drug therapy: Secondary | ICD-10-CM | POA: Insufficient documentation

## 2018-06-06 DIAGNOSIS — K644 Residual hemorrhoidal skin tags: Secondary | ICD-10-CM | POA: Insufficient documentation

## 2018-06-06 DIAGNOSIS — K5900 Constipation, unspecified: Secondary | ICD-10-CM | POA: Insufficient documentation

## 2018-06-06 MED ORDER — HYDROCORTISONE 2.5 % RE CREA: 1.0000 "application " | TOPICAL_CREAM | Freq: Two times a day (BID) | RECTAL | 0 refills | Status: DC

## 2018-06-06 NOTE — Discharge Instructions (Addendum)
It is important that you try to avoid constipation.  Do not strain to have a bowel movement and avoid heavy lifting.  Sitz baths will also help.  Drink plenty of water.  You may also add fiber to your diet such as fruits, vegetables, and leafy greens.  You can take over-the-counter milk of magnesia as directed during pregnancy you may also try Metamucil or MiraLAX daily as directed.  Follow-up with your gynecologist next week for recheck.

## 2018-06-06 NOTE — ED Triage Notes (Signed)
Pt c/o rectal pain x 5 days. Reports she has a lesion on her rectum and is unsure if it is a hemorrhoid or abscess. Endorses straining with BM. Denies bleeding or drainage. No abdominal pain or vaginal bleeding/discharge. Pt is approx [redacted] weeks pregnant. EED 07/29/18. Pt sees [facility] for prenatal care. FHT 153.

## 2018-06-08 NOTE — ED Provider Notes (Signed)
[facility] EMERGENCY DEPARTMENT Provider Note   CSN: [id number] Arrival date & time: 06/06/18  0901     History   Chief Complaint Chief Complaint  Patient presents with  . Rectal Pain    HPI Stacy Wall is a 27 y.o. female.  HPI   Stacy Wall is a 27 y.o. female who is [redacted] week pregnant, G3P2Ab0 who presents to the Emergency Department complaining of rectal pain for 5 days.  She states that she feels a knot at her rectum and she is unsure if this is a hemorrhoid or an abscess.  She endorses some recent constipation and straining to have a bowel movement.  She denies bleeding or drainage from her rectum.  She has not tried any therapies or medications prior to arrival.  She denies abdominal pain, vaginal bleeding, fever, chills, or pain to her extremities.  She end[facility] and denies any complications with pregnancy thus far.  Past Medical History:  Diagnosis Date  . Chronic back pain   . Gallstones   . Herpes     Patient Active Problem List   Diagnosis Date Noted  . Supervision of normal pregnancy 04/28/2018  . Late prenatal care in second trimester 04/28/2018  . 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 NO PAST SURGERIES       OB History    Gravida  3   Para  2   Term  2   Preterm      AB      Living  2     SAB      TAB      Ectopic      Multiple      Live Births  2            Home Medications    Prior to Admission medications   Medication Sig Start Date End Date Taking? Authorizing Provider  Prenatal Vit-Fe Fumarate-FA (PRENATAL COMPLETE) 14-0.4 MG TABS Take 1 tablet by mouth daily. 02/12/18  Yes Law, Waylan Boga, PA-C  hydrocortisone (ANUSOL-HC) 2.5 % rectal cream Place 1 application rectally 2 (two) times daily. 06/06/18   Pauline Aus, PA-C    Family History Family History  Problem Relation Age of Onset  . Diabetes  Maternal Aunt   . Cancer Maternal Grandmother        breast, ovarian  . Cancer Maternal Grandfather        colon    Social History Social History   Tobacco Use  . Smoking status: Never Smoker  . Smokeless tobacco: Never Used  Substance Use Topics  . Alcohol use: No  . Drug use: No     Allergies   Hydrocodone   Review of Systems Review of Systems  Constitutional: Negative for appetite change, chills and fever.  Respiratory: Negative for chest tightness and shortness of breath.   Cardiovascular: Negative for chest pain and leg swelling.  Gastrointestinal: Positive for constipation and rectal pain. Negative for abdominal pain, blood in stool, diarrhea and vomiting.  Genitourinary: Negative for difficulty urinating, dysuria, flank pain and vaginal bleeding.  Musculoskeletal: Negative for back pain.  Skin: Negative for rash.  Neurological: Negative for weakness and numbness.     Physical Exam Updated Vital Signs BP 110/86 (BP Location: Left Arm)   Pulse 86   Temp 98.1 F (36.7 C) (Oral)   Resp 14   Ht 5\' 5"  (  1.651 m)   Wt 77.1 kg   SpO2 100%   BMI 28.29 kg/m   Physical Exam  Constitutional: She appears well-developed and well-nourished. No distress.  HENT:  Head: Atraumatic.  Cardiovascular: Normal rate, regular rhythm and intact distal pulses.  Pulmonary/Chest: Effort normal and breath sounds normal. No respiratory distress.  Abdominal: Soft. There is no tenderness.  Abdomen is gravid  Genitourinary:  Genitourinary Comments: 2 small non-thrombosed external hemorrhoids and one possible internal hemorrhoid palpated.  No bleeding.  No edema, erythema, or fluctuance of the perineum.  Musculoskeletal: Normal range of motion.  Neurological: She is alert. No sensory deficit.  Skin: Skin is warm. Capillary refill takes less than 2 seconds.  Psychiatric: She has a normal mood and affect.  Nursing note and vitals reviewed.    ED Treatments / Results  Labs (all  labs ordered are listed, but only abnormal results are displayed) Labs Reviewed - No data to display  EKG None  Radiology No results found.  Procedures Procedures (including critical care time)  Medications Ordered in ED Medications - No data to display   Initial Impression / Assessment and Plan / ED Course  I have reviewed the triage vital signs and the nursing notes.  Pertinent labs & imaging results that were available during my care of the patient were reviewed by me and considered in my medical decision making (see chart for details).     FHT 153.  No abdominal pain on exam.  Well appearing.  External Hemorrhoids on exam.  No concerning sx's for abscess.  Pt agrees to sitz baths, increased water intake and fiber in her diet.  Pt appropriate for d/c home.  outpt f/u.  Return precautions discussed.    Final Clinical Impressions(s) / ED Diagnoses   Final diagnoses:  External hemorrhoids    ED Discharge Orders         Ordered    hydrocortisone (ANUSOL-HC) 2.5 % rectal cream  2 times daily     06/06/18 0949           Pauline Aus, PA-C 06/08/18 2129    Samuel Jester, DO 06/10/18 1533

## 2018-07-29 ENCOUNTER — Encounter (HOSPITAL_COMMUNITY): Payer: Self-pay | Admitting: *Deleted

## 2018-07-29 ENCOUNTER — Inpatient Hospital Stay (HOSPITAL_COMMUNITY)
Admission: AD | Admit: 2018-07-29 | Discharge: 2018-07-29 | Disposition: A | Discharge status: Home / Self Care | Payer: Self-pay | Source: Ambulatory Visit | Attending: Obstetrics and Gynecology | Admitting: Obstetrics and Gynecology

## 2018-07-29 ENCOUNTER — Other Ambulatory Visit: Payer: Self-pay

## 2018-07-29 DIAGNOSIS — N949 Unspecified condition associated with female genital organs and menstrual cycle: Secondary | ICD-10-CM

## 2018-07-29 DIAGNOSIS — R102 Pelvic and perineal pain: Secondary | ICD-10-CM | POA: Insufficient documentation

## 2018-07-29 DIAGNOSIS — O26893 Other specified pregnancy related conditions, third trimester: Secondary | ICD-10-CM | POA: Insufficient documentation

## 2018-07-29 DIAGNOSIS — Z3A40 40 weeks gestation of pregnancy: Secondary | ICD-10-CM | POA: Insufficient documentation

## 2018-07-29 NOTE — MAU Provider Note (Signed)
.. Chief Complaint:  Pelvic Pain   First Provider Initiated Contact with Patient 07/29/18 2016      HPI: Stacy Wall is a 28 y.o. G3P2002 at 79w0dwho presents to maternity admissions reporting pressure in pelvis.  Hurts more at night.  Hurts in bones.  Feels balling up but doesn't know if they are contractions.  Cannot remember what they feel like.  . She reports good fetal movement, denies LOF, vaginal bleeding, vaginal itching/burning, urinary symptoms, h/a, dizziness, n/v, diarrhea, constipation or fever/chills.    Has only had one prenatal visit due to not being able to pay.  Has no plan to be induced.  RN Note: Pt presents to mau c/o pelvic pain that has been ongoing x2weeks but has worsened today. Pt denies bleeding or LOF. +FM  Past Medical History: Past Medical History:  Diagnosis Date  . Chronic back pain   . Gallstones   . Herpes     Past obstetric history: OB History  Gravida Para Term Preterm AB Living  3 2 2     2   SAB TAB Ectopic Multiple Live Births          2    # Outcome Date GA Lbr Len/2nd Weight Sex Delivery Anes PTL Lv  3 Current           2 Term 12/30/10 [redacted]w[redacted]d  3260 g F Vag-Spont EPI N LIV  1 Term 08/19/09 [redacted]w[redacted]d  2807 g M Vag-Spont EPI N LIV    Past Surgical History: Past Surgical History:  Procedure Laterality Date  . NO PAST SURGERIES      Family History: Family History  Problem Relation Age of Onset  . Diabetes Maternal Aunt   . Cancer Maternal Grandmother        breast, ovarian  . Cancer Maternal Grandfather        colon    Social History: Social History   Tobacco Use  . Smoking status: Never Smoker  . Smokeless tobacco: Never Used  Substance Use Topics  . Alcohol use: No  . Drug use: No    Allergies:  Allergies  Allergen Reactions  . Hydrocodone Rash    Meds:  Medications Prior to Admission  Medication Sig Dispense Refill Last Dose  . hydrocortisone (ANUSOL-HC) 2.5 % rectal cream Place 1 application rectally 2  (two) times daily. 30 g 0 Unknown at Unknown time  . Prenatal Vit-Fe Fumarate-FA (PRENATAL COMPLETE) 14-0.4 MG TABS Take 1 tablet by mouth daily. 60 each 0 Unknown at Unknown time    I have reviewed patient's Past Medical Hx, Surgical Hx, Family Hx, Social Hx, medications and allergies.   ROS:  Review of Systems  Constitutional: Negative for chills and fever.  Respiratory: Negative for shortness of breath.   Genitourinary: Positive for pelvic pain. Negative for difficulty urinating and vaginal bleeding.  Neurological: Negative for dizziness.   Other systems negative  Physical Exam   Patient Vitals for the past 24 hrs:  BP Temp Temp src Pulse Resp Weight  07/29/18 1953 132/83 98 F (36.7 C) Oral 74 17 87.5 kg   Constitutional: Well-developed, well-nourished female in no acute distress.  Cardiovascular: normal rate and rhythm Respiratory: normal effort, clear to auscultation bilaterally GI: Abd soft, non-tender, gravid appropriate for gestational age.   No rebound or guarding. MS: Extremities nontender, no edema, normal ROM Neurologic: Alert and oriented x 4.  GU: Neg CVAT.  PELVIC EXAM:  Dilation: Fingertip Effacement (%): 60, 70 Cervical Position: Posterior Station: -  2, -1 Presentation: Vertex Exam by:: Mayford Knife CNM  FHT:  Baseline 140 , moderate variability, accelerations present, no decelerations Contractions: Occasional and Irregular     Labs: O/Positive/-- (10/01 1133) No results found for this or any previous visit (from the past 24 hour(s)).  Imaging:  No results found.  MAU Course/MDM: NST reviewed, reactive Reviewed normal aches and pains of fetal head engagement. Reviewed signs of labor .  Treatments in MAU included EFM.    Assessment: Single intrauterine pregnancy at [redacted]w[redacted]d Pelvic pressure and discomfort  Plan: Discharge home Labor precautions and fetal kick counts Follow up in Office for prenatal visits and recheck of status Discussed she needs  to go to office if she wants to schedule an  Induction of labor  Encouraged to return here or to other Urgent Care/ED if she develops worsening of symptoms, increase in pain, fever, or other concerning symptoms.   Pt stable at time of discharge.  Wynelle Bourgeois CNM, MSN Certified Nurse-Midwife 07/29/2018 8:31 PM

## 2018-07-29 NOTE — MAU Note (Signed)
Pt presents to mau c/o pelvic pain that has been ongoing x2weeks but has worsened today. Pt denies bleeding or LOF. +FM

## 2018-07-29 NOTE — Discharge Instructions (Signed)

## 2018-07-29 NOTE — L&D Delivery Note (Addendum)
Delivery Note At 6:27 AM a viable and healthy female was delivered via Vaginal, Spontaneous (Presentation: LOA ; cephalic  ).  APGAR: 8,9 ;  Placenta status: spontaneous, intact .  Cord: 3 vessel with no complications   Anesthesia:  epidural Episiotomy:  N/a Lacerations:  None Suture Repair: N/A Est. Blood Loss (mL):  QbL . Bleeding mostly prior to delivery of placenta. Excellent tone after delivery and no additional bleeding noted. Small clot was expressed with fundal massage.   Mom to postpartum.  Baby to Couplet care / Skin to Skin.  Myrene Buddy 08/03/2018, 6:48 AM  Attestation: I was present and available for assistance throughout the delivery. No complications aside from [facility].   Cristal Deer. Earlene Plater, DO OB/GYN Fellow

## 2018-07-30 ENCOUNTER — Encounter: Payer: Self-pay | Admitting: Obstetrics & Gynecology

## 2018-08-02 ENCOUNTER — Other Ambulatory Visit: Payer: Self-pay

## 2018-08-02 ENCOUNTER — Inpatient Hospital Stay (HOSPITAL_COMMUNITY)
Admission: AD | Admit: 2018-08-02 | Discharge: 2018-08-04 | DRG: 807 | Disposition: A | Discharge status: Home / Self Care | Payer: Medicaid Other | Attending: Obstetrics and Gynecology | Admitting: Obstetrics and Gynecology

## 2018-08-02 ENCOUNTER — Encounter (HOSPITAL_COMMUNITY): Payer: Self-pay

## 2018-08-02 DIAGNOSIS — O480 Post-term pregnancy: Secondary | ICD-10-CM | POA: Diagnosis not present

## 2018-08-02 DIAGNOSIS — Z3A40 40 weeks gestation of pregnancy: Secondary | ICD-10-CM

## 2018-08-02 DIAGNOSIS — O0932 Supervision of pregnancy with insufficient antenatal care, second trimester: Secondary | ICD-10-CM

## 2018-08-02 DIAGNOSIS — Z3483 Encounter for supervision of other normal pregnancy, third trimester: Secondary | ICD-10-CM | POA: Diagnosis present

## 2018-08-02 LAB — RAPID URINE DRUG SCREEN, HOSP PERFORMED
Amphetamines: NOT DETECTED
Barbiturates: NOT DETECTED
Benzodiazepines: NOT DETECTED
Cocaine: NOT DETECTED
Opiates: NOT DETECTED
Tetrahydrocannabinol: NOT DETECTED

## 2018-08-02 LAB — CBC
HCT: 38.2 % (ref 36.0–46.0)
Hemoglobin: 12.8 g/dL (ref 12.0–15.0)
MCH: 30.4 pg (ref 26.0–34.0)
MCHC: 33.5 g/dL (ref 30.0–36.0)
MCV: 90.7 fL (ref 80.0–100.0)
Platelets: 231 10*3/uL (ref 150–400)
RBC: 4.21 MIL/uL (ref 3.87–5.11)
RDW: 13.7 % (ref 11.5–15.5)
WBC: 10.4 10*3/uL (ref 4.0–10.5)
nRBC: 0 % (ref 0.0–0.2)

## 2018-08-02 LAB — TYPE AND SCREEN
ABO/RH(D): O POS
Antibody Screen: NEGATIVE

## 2018-08-02 LAB — ABO/RH: ABO/RH(D): O POS

## 2018-08-02 LAB — OB RESULTS CONSOLE GBS: GBS: NEGATIVE

## 2018-08-02 LAB — GROUP B STREP BY PCR: Group B strep by PCR: NEGATIVE

## 2018-08-02 MED ORDER — SOD CITRATE-CITRIC ACID 500-334 MG/5ML PO SOLN: 30.0000 mL | ORAL | Status: DC | PRN

## 2018-08-02 MED ORDER — ACETAMINOPHEN 325 MG PO TABS: 650.0000 mg | ORAL_TABLET | ORAL | Status: DC | PRN

## 2018-08-02 MED ORDER — MISOPROSTOL 50MCG HALF TABLET
50.0000 ug | ORAL_TABLET | Freq: Once | ORAL | Status: AC
  Administered 2018-08-02: 50 ug via BUCCAL
  Filled 2018-08-02: qty 1

## 2018-08-02 MED ORDER — OXYTOCIN BOLUS FROM INFUSION
500.0000 mL | Freq: Once | INTRAVENOUS | Status: AC
  Administered 2018-08-03: 500 mL via INTRAVENOUS

## 2018-08-02 MED ORDER — OXYTOCIN 40 UNITS IN LACTATED RINGERS INFUSION - SIMPLE MED
2.5000 [IU]/h | INTRAVENOUS | Status: DC
  Administered 2018-08-03: 2.5 [IU]/h via INTRAVENOUS

## 2018-08-02 MED ORDER — VALACYCLOVIR HCL 500 MG PO TABS
500.0000 mg | ORAL_TABLET | Freq: Two times a day (BID) | ORAL | Status: DC
  Administered 2018-08-02: 500 mg via ORAL
  Filled 2018-08-02 (×3): qty 1

## 2018-08-02 MED ORDER — TERBUTALINE SULFATE 1 MG/ML IJ SOLN
0.2500 mg | Freq: Once | INTRAMUSCULAR | Status: DC | PRN
  Filled 2018-08-02: qty 1

## 2018-08-02 MED ORDER — LACTATED RINGERS IV SOLN: 500.0000 mL | INTRAVENOUS | Status: DC | PRN

## 2018-08-02 MED ORDER — LACTATED RINGERS IV SOLN
INTRAVENOUS | Status: DC
  Administered 2018-08-02 – 2018-08-03 (×2): via INTRAVENOUS

## 2018-08-02 MED ORDER — OXYCODONE-ACETAMINOPHEN 5-325 MG PO TABS: 1.0000 | ORAL_TABLET | ORAL | Status: DC | PRN

## 2018-08-02 MED ORDER — LIDOCAINE HCL (PF) 1 % IJ SOLN
30.0000 mL | INTRAMUSCULAR | Status: DC | PRN
  Filled 2018-08-02: qty 30

## 2018-08-02 MED ORDER — ONDANSETRON HCL 4 MG/2ML IJ SOLN
4.0000 mg | Freq: Four times a day (QID) | INTRAMUSCULAR | Status: DC | PRN
Start: 2018-08-02 — End: 2018-08-03

## 2018-08-02 MED ORDER — OXYCODONE-ACETAMINOPHEN 5-325 MG PO TABS: 2.0000 | ORAL_TABLET | ORAL | Status: DC | PRN

## 2018-08-02 NOTE — Progress Notes (Signed)
MD notified of pt status.  Small change made to cervix.  Pt to stay for 1 more hour of evaluation.

## 2018-08-02 NOTE — Anesthesia Pain Management Evaluation Note (Signed)
  CRNA Pain Management Visit Note  Patient: Stacy Wall, 28 y.o., female  "Hello I am a member of the anesthesia team at [facility]. We have an anesthesia team available at all times to provide care throughout the hospital, including epidural management and anesthesia for C-section. I don't know your plan for the delivery whether it a natural birth, water birth, IV sedation, nitrous supplementation, doula or epidural, but we want to meet your pain goals."   1.Was your pain managed to your expectations on prior hospitalizations?   Yes   2.What is your expectation for pain management during this hospitalization?     Epidural  3.How can we help you reach that goal? epidural  Record the patient's initial score and the patient's pain goal.   Pain: 7  Pain Goal: 4 [facility] wants you to be able to say your pain was always managed very well.  Tidus Upchurch 08/02/2018

## 2018-08-02 NOTE — Progress Notes (Signed)
OB/GYN Faculty Practice: Labor Progress Note  Subjective: Doing well, no complaints. Multiple family members here.   Objective: BP (!) 141/84   Pulse 88   Temp 98.5 F (36.9 C) (Oral)   Resp 16   Ht 5\' 5"  (1.651 m)   Wt 86.2 kg   BMI 31.62 kg/m  Gen: well-appearing, NAD, appears comfortable Dilation: 3.5 Effacement (%): 50 Station: -2 Presentation: Vertex Exam by:: Dr. Marlis Edelson  Assessment and Plan: 28 y.o. [id number] [redacted]w[redacted]d here with admission for SOL. Pregnancy complicated by limited prenatal care, history of genital herpes (not on prophylaxis but no active lesions).   Labor: Not in active labor. Reported to have progressed in MAU but actually 3-4cm on my exam with cervix only 50% effaced. Contracting irregularly but have spaced out. Will given one dose of cytotec then consider early AROM.  -- pain control: desires epidural  -- PPH Risk: low  Fetal Well-Being: EFW 6-7lbs by Leopolds. Cephalic by sutures.  -- Category I - continuous fetal monitoring  -- GBS negative    Laurel S. Earlene Plater, DO OB/GYN Fellow, Faculty Practice  9:41 PM

## 2018-08-02 NOTE — H&P (Addendum)
LABOR AND DELIVERY ADMISSION HISTORY AND PHYSICAL NOTE  Stacy Wall is a 28 y.o. female [phone] with IUP at [redacted]w[redacted]d by 2nd trimester Ultrasound presenting for labor. Was in MAU for a few hours and had slow change over several hours. Also with limited prenatal care. States contractions started overnight and were back to back but now less intense.   She has a history of genital herpes. This was diagnosed in 2017, last outbreak was 2018 which resolved spontaneously.   She reports positive fetal movement. She denies leakage of fluid. She had very minimal vaginal bleeding (spotting) with slimy mucous this morning.  Prenatal History/Complications: PNC at [facility]  Pregnancy complications:  - None  Past Medical History: Past Medical History:  Diagnosis Date  . Chronic back pain   . Gallstones   . Herpes    Past Surgical History: Past Surgical History:  Procedure Laterality Date  . NO PAST SURGERIES      Obstetrical History: OB History    Gravida  3   Para  2   Term  2   Preterm      AB      Living  2     SAB      TAB      Ectopic      Multiple      Live Births  2           Social History: Social History   Socioeconomic History  . Marital status: Single    Spouse name: Engineer, petroleum  . Number of children: 2  . Years of education: Not on file  . Highest education level: 10th grade  Occupational History  . Not on file  Social Needs  . Financial resource strain: Not hard at all  . Food insecurity:    Worry: Never true    Inability: Never true  . Transportation needs:    Medical: No    Non-medical: No  Tobacco Use  . Smoking status: Never Smoker  . Smokeless tobacco: Never Used  Substance and Sexual Activity  . Alcohol use: No  . Drug use: No  . Sexual activity: Yes    Birth control/protection: None  Lifestyle  . Physical activity:    Days per week: 2 days    Minutes per session: 30 min  . Stress: Not at all  Relationships  . Social  connections:    Talks on phone: More than three times a week    Gets together: Once a week    Attends religious service: Never    Active member of club or organization: No    Attends meetings of clubs or organizations: Never    Relationship status: Living with partner  Other Topics Concern  . Not on file  Social History Narrative  . Not on file    Family History: Family History  Problem Relation Age of Onset  . Diabetes Maternal Aunt   . Cancer Maternal Grandmother        breast, ovarian  . Cancer Maternal Grandfather        colon    Allergies: Allergies  Allergen Reactions  . Hydrocodone Rash    Medications Prior to Admission  Medication Sig Dispense Refill Last Dose  . hydrocortisone (ANUSOL-HC) 2.5 % rectal cream Place 1 application rectally 2 (two) times daily. 30 g 0 Unknown at Unknown time  . Prenatal Vit-Fe Fumarate-FA (PRENATAL COMPLETE) 14-0.4 MG TABS Take 1 tablet by mouth daily. 60 each 0 Unknown at Unknown  time   Review of Systems  All systems reviewed and negative except as stated in HPI  Physical Exam Blood pressure 124/76, pulse (!) 111, temperature 97.9 F (36.6 C), resp. rate 18, height 5\' 5"  (1.651 m), weight 86.2 kg. General appearance: alert, oriented, NAD Lungs: normal respiratory effort Heart: regular rate Abdomen: soft, non-tender; gravid, FH appropriate for GA Extremities: No calf swelling or tenderness Presentation: cephalic Fetal monitoring: Baseline 135 bpm, moderate variability, accelerations 15x15, no decels Uterine activity:  Dilation: 4.5 Effacement (%): 80 Station: -1 Exam by:: Polos RN  Prenatal labs: ABO, Rh: O/Positive/-- (10/01 1133) Antibody: Negative (10/08 0844) Rubella: 3.94 (10/01 1133) RPR: Non Reactive (10/08 0844)  HBsAg: Negative (10/01 1133)  HIV: Non Reactive (10/08 0844)  GC/Chlamydia: Negative/Negative GBS:   PENDING 2-hr GTT: Negative Genetic screening:  WNLs Anatomy US: WNL, female  Prenatal Transfer  Tool  Maternal Diabetes: No Genetic Screening: Normal Maternal Ultrasounds/Referrals: Normal Fetal Ultrasounds or other Referrals:  None Maternal Substance Abuse:  No Significant Maternal Medications:  None Significant Maternal Lab Results: None  Results for orders placed or performed during the hospital encounter of 08/02/18 (from the past 24 hour(s))  CBC   Collection Time: 08/02/18  7:05 PM  Result Value Ref Range   WBC 10.4 4.0 - 10.5 K/uL   RBC 4.21 3.87 - 5.11 MIL/uL   Hemoglobin 12.8 12.0 - 15.0 g/dL   HCT 11.9 41.7 - 40.8 %   MCV 90.7 80.0 - 100.0 fL   MCH 30.4 26.0 - 34.0 pg   MCHC 33.5 30.0 - 36.0 g/dL   RDW 14.4 81.8 - 56.3 %   Platelets 231 150 - 400 K/uL   nRBC 0.0 0.0 - 0.2 %    Patient Active Problem List   Diagnosis Date Noted  . Normal labor 08/02/2018  . Supervision of normal pregnancy 04/28/2018  . Late prenatal care in second trimester 04/28/2018  . 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 G3P2002 at [redacted]w[redacted]d here for labor.  #Labor: Latent #Pain: 0/10 #FWB: Cat I #ID:  GBS pending #MOF: Breast #MOC: Unsure  Stacy Wall 08/02/2018, 7:20 PM   Attestation: I have seen this patient and agree with the resident's documentation. I have examined them separately, and we have discussed the plan of care.  Stacy Wall. Stacy Plater, DO OB/GYN Fellow

## 2018-08-02 NOTE — MAU Note (Signed)
States ctx have been getting closer, about every 4-5 min  States this AM she saw a little bit of blood on the toilet paper and mucus mixed in.  No LOF, + FM

## 2018-08-02 NOTE — MAU Note (Signed)
Pt I a G3P2 at 40.4 weeks c/o contractions since 1/1, not ctx pattern is more frequent, stronger and pt reports small amount of pink spotting this am.

## 2018-08-03 ENCOUNTER — Inpatient Hospital Stay (HOSPITAL_COMMUNITY): Payer: Medicaid Other | Admitting: Anesthesiology

## 2018-08-03 ENCOUNTER — Telehealth: Payer: Self-pay | Admitting: *Deleted

## 2018-08-03 LAB — RPR: RPR Ser Ql: NONREACTIVE

## 2018-08-03 MED ORDER — LIDOCAINE-EPINEPHRINE (PF) 2 %-1:200000 IJ SOLN
INTRAMUSCULAR | Status: DC | PRN
  Administered 2018-08-03: 3 mL via EPIDURAL
  Administered 2018-08-03: 4 mL via EPIDURAL

## 2018-08-03 MED ORDER — IBUPROFEN 600 MG PO TABS
600.0000 mg | ORAL_TABLET | Freq: Four times a day (QID) | ORAL | Status: DC
  Administered 2018-08-03 – 2018-08-04 (×4): 600 mg via ORAL
  Filled 2018-08-03 (×4): qty 1

## 2018-08-03 MED ORDER — ONDANSETRON HCL 4 MG/2ML IJ SOLN: 4.0000 mg | INTRAMUSCULAR | Status: DC | PRN

## 2018-08-03 MED ORDER — PHENYLEPHRINE 40 MCG/ML (10ML) SYRINGE FOR IV PUSH (FOR BLOOD PRESSURE SUPPORT)
80.0000 ug | PREFILLED_SYRINGE | INTRAVENOUS | Status: DC | PRN
  Filled 2018-08-03: qty 10

## 2018-08-03 MED ORDER — EPHEDRINE 5 MG/ML INJ
10.0000 mg | INTRAVENOUS | Status: DC | PRN
  Filled 2018-08-03: qty 2

## 2018-08-03 MED ORDER — PRENATAL MULTIVITAMIN CH
1.0000 | ORAL_TABLET | Freq: Every day | ORAL | Status: DC
  Administered 2018-08-03: 1 via ORAL
  Filled 2018-08-03: qty 1

## 2018-08-03 MED ORDER — SENNOSIDES-DOCUSATE SODIUM 8.6-50 MG PO TABS
2.0000 | ORAL_TABLET | ORAL | Status: DC
  Administered 2018-08-03: 2 via ORAL
  Filled 2018-08-03: qty 2

## 2018-08-03 MED ORDER — ZOLPIDEM TARTRATE 5 MG PO TABS: 5.0000 mg | ORAL_TABLET | Freq: Every evening | ORAL | Status: DC | PRN

## 2018-08-03 MED ORDER — BENZOCAINE-MENTHOL 20-0.5 % EX AERO
1.0000 "application " | INHALATION_SPRAY | CUTANEOUS | Status: DC | PRN
  Administered 2018-08-03: 1 via TOPICAL
  Filled 2018-08-03: qty 56

## 2018-08-03 MED ORDER — TETANUS-DIPHTH-ACELL PERTUSSIS 5-2.5-18.5 LF-MCG/0.5 IM SUSP: 0.5000 mL | Freq: Once | INTRAMUSCULAR | Status: DC

## 2018-08-03 MED ORDER — FENTANYL 2.5 MCG/ML BUPIVACAINE 1/10 % EPIDURAL INFUSION (WH - ANES)
INTRAMUSCULAR | Status: AC
  Filled 2018-08-03: qty 100

## 2018-08-03 MED ORDER — WITCH HAZEL-GLYCERIN EX PADS: 1.0000 "application " | MEDICATED_PAD | CUTANEOUS | Status: DC | PRN

## 2018-08-03 MED ORDER — DIPHENHYDRAMINE HCL 50 MG/ML IJ SOLN: 12.5000 mg | INTRAMUSCULAR | Status: DC | PRN

## 2018-08-03 MED ORDER — LACTATED RINGERS IV SOLN: 500.0000 mL | Freq: Once | INTRAVENOUS | Status: DC

## 2018-08-03 MED ORDER — FENTANYL 2.5 MCG/ML BUPIVACAINE 1/10 % EPIDURAL INFUSION (WH - ANES)
14.0000 mL/h | INTRAMUSCULAR | Status: DC | PRN
  Administered 2018-08-03: 14 mL/h via EPIDURAL

## 2018-08-03 MED ORDER — PHENYLEPHRINE 40 MCG/ML (10ML) SYRINGE FOR IV PUSH (FOR BLOOD PRESSURE SUPPORT)
PREFILLED_SYRINGE | INTRAVENOUS | Status: AC
  Filled 2018-08-03: qty 10

## 2018-08-03 MED ORDER — ACETAMINOPHEN 325 MG PO TABS: 650.0000 mg | ORAL_TABLET | ORAL | Status: DC | PRN

## 2018-08-03 MED ORDER — SIMETHICONE 80 MG PO CHEW: 80.0000 mg | CHEWABLE_TABLET | ORAL | Status: DC | PRN

## 2018-08-03 MED ORDER — ONDANSETRON HCL 4 MG PO TABS: 4.0000 mg | ORAL_TABLET | ORAL | Status: DC | PRN

## 2018-08-03 MED ORDER — COCONUT OIL OIL
1.0000 "application " | TOPICAL_OIL | Status: DC | PRN
  Filled 2018-08-03: qty 120

## 2018-08-03 MED ORDER — DIBUCAINE 1 % RE OINT: 1.0000 "application " | TOPICAL_OINTMENT | RECTAL | Status: DC | PRN

## 2018-08-03 MED ORDER — OXYTOCIN 40 UNITS IN LACTATED RINGERS INFUSION - SIMPLE MED
1.0000 m[IU]/min | INTRAVENOUS | Status: DC
  Administered 2018-08-03: 2 m[IU]/min via INTRAVENOUS
  Administered 2018-08-03: 4 m[IU]/min via INTRAVENOUS
  Filled 2018-08-03: qty 1000

## 2018-08-03 MED ORDER — DIPHENHYDRAMINE HCL 25 MG PO CAPS: 25.0000 mg | ORAL_CAPSULE | Freq: Four times a day (QID) | ORAL | Status: DC | PRN

## 2018-08-03 NOTE — Progress Notes (Signed)
Stacy Wall is a 28 y.o. G3P2002 at [redacted]w[redacted]d   Subjective: Doing well, very comfortable now with epidural.  Objective: BP 126/80   Pulse 75   Temp 98.7 F (37.1 C) (Axillary)   Resp 16   Ht 5\' 5"  (1.651 m)   Wt 86.2 kg   SpO2 100%   BMI 31.62 kg/m  No intake/output data recorded. No intake/output data recorded.  FHT:  FHR: 135 bpm, variability: moderate,  accelerations:  Present,  decelerations:  Absent UC:   regular, every 3-4 minutes SVE:   Dilation: 5.5 Effacement (%): 80 Station: -2 Exam by:: Dr. Primitivo Gauze  Labs: Lab Results  Component Value Date   WBC 10.4 08/02/2018   HGB 12.8 08/02/2018   HCT 38.2 08/02/2018   MCV 90.7 08/02/2018   PLT 231 08/02/2018    Assessment / Plan: Making some progress on pitocin. Felt pop and heard sound "of a water gun". Did not notice any fluid rushing out but admits she did go to the restroom. Unable to feel any remaining amniotic fluid, will consider SROM @ ~0300.  Labor: progressing on pitocin, SROM @~0300 Preeclampsia:  N/A Fetal Wellbeing:  Category I Pain Control:  Epidural I/D:  n/a Anticipated MOD:  NSVD  Stacy Wall 08/03/2018, 5:09 AM

## 2018-08-03 NOTE — Anesthesia Procedure Notes (Signed)
Epidural Patient location during procedure: OB Start time: 08/03/2018 2:30 AM End time: 08/03/2018 2:45 AM  Staffing Anesthesiologist: Elmer Picker, MD Performed: anesthesiologist   Preanesthetic Checklist Completed: patient identified, pre-op evaluation, timeout performed, IV checked, risks and benefits discussed and monitors and equipment checked  Epidural Patient position: sitting Prep: site prepped and draped and DuraPrep Patient monitoring: continuous pulse ox, blood pressure, heart rate and cardiac monitor Approach: midline Location: L3-L4 Injection technique: LOR air  Needle:  Needle type: Tuohy  Needle gauge: 17 G Needle length: 9 cm Needle insertion depth: 6 cm Catheter type: closed end flexible Catheter size: 19 Gauge Catheter at skin depth: 12 cm Test dose: negative  Assessment Sensory level: T8 Events: blood not aspirated, injection not painful, no injection resistance, negative IV test and no paresthesia  Additional Notes Patient identified. Risks/Benefits/Options discussed with patient including but not limited to bleeding, infection, nerve damage, paralysis, failed block, incomplete pain control, headache, blood pressure changes, nausea, vomiting, reactions to medication both or allergic, itching and postpartum back pain. Confirmed with bedside nurse the patient's most recent platelet count. Confirmed with patient that they are not currently taking any anticoagulation, have any bleeding history or any family history of bleeding disorders. Patient expressed understanding and wished to proceed. All questions were answered. Sterile technique was used throughout the entire procedure. Please see nursing notes for vital signs. Test dose was given through epidural catheter and negative prior to continuing to dose epidural or start infusion. Warning signs of high block given to the patient including shortness of breath, tingling/numbness in hands, complete motor block, or  any concerning symptoms with instructions to call for help. Patient was given instructions on fall risk and not to get out of bed. All questions and concerns addressed with instructions to call with any issues or inadequate analgesia.  Reason for block:procedure for pain

## 2018-08-03 NOTE — Progress Notes (Signed)
OB/GYN Faculty Practice: Labor Progress Note  Subjective: Contractions more intense now, coming more often. Family still in room. Wants to get epidural soon.   Objective: BP 127/73   Pulse 75   Temp 98.7 F (37.1 C) (Axillary)   Resp 16   Ht 5\' 5"  (1.651 m)   Wt 86.2 kg   BMI 31.62 kg/m  Gen: appears uncomfortable during contractions  Dilation: 5 Effacement (%): 50 Station: -2 Presentation: Vertex Exam by:: D. Wallace  Assessment and Plan: 28 y.o. [id number] [redacted]w[redacted]d here with admission for SOL. Pregnancy complicated by limited prenatal care, history of genital herpes (not on prophylaxis but no active lesions).   Labor: Transitioning. Received one dose of cytotec with good cervical change. Head engaged for AROM but patient would prefer to wait until epidural placed.  -- start pitocin, titrate per protocol  -- pain control: epidural now  -- PPH Risk: low  Fetal Well-Being: EFW 6-7lbs by Leopolds. Cephalic by sutures.  -- Category I - continuous fetal monitoring  -- GBS negative    Laurel S. Earlene Plater, DO OB/GYN Fellow, Faculty Practice  1:35 AM

## 2018-08-03 NOTE — Progress Notes (Signed)
Called into patient room because complaining of increased pressure. SVE now 10/100/+2. Plan to labor down for about 30 minutes since not feeling much pressure then will start pushing.

## 2018-08-03 NOTE — Anesthesia Postprocedure Evaluation (Signed)
Anesthesia Post Note  Patient: Stacy Wall  Procedure(s) Performed: AN AD HOC LABOR EPIDURAL     Patient location during evaluation: Mother Baby Anesthesia Type: Epidural Level of consciousness: awake and alert Pain management: pain level controlled Vital Signs Assessment: post-procedure vital signs reviewed and stable Respiratory status: spontaneous breathing, nonlabored ventilation and respiratory function stable Cardiovascular status: stable Postop Assessment: no headache, no backache and epidural receding Anesthetic complications: no    Last Vitals:  Vitals:   08/03/18 0859 08/03/18 1300  BP: 114/74 108/62  Pulse: 85 92  Resp: 18 18  Temp: 36.7 C 36.8 C  SpO2:      Last Pain:  Vitals:   08/03/18 1300  TempSrc: Oral  PainSc:    Pain Goal:                 [facility]

## 2018-08-03 NOTE — Anesthesia Preprocedure Evaluation (Signed)

## 2018-08-03 NOTE — Telephone Encounter (Signed)
Lmom for pt to call us back to set up pp appt.  08-03-2018  AS

## 2018-08-03 NOTE — Lactation Note (Signed)
This note was copied from a baby's chart. Lactation Consultation Note  Patient Name: Stacy Wall [id number] Date: 08/03/2018 Reason for consult: Initial assessment;Term  P2 mother whose infant is now 29 hours old  Baby was sleeping in mother's arms when I arrived.  Mother had recently fed her.  She had no questions/concerns related to breast feeding.  She felt like baby latched well and denied pain with latching.  Encouraged to feed 8-12 times/24 hours or sooner if baby shows feeding cues.  Reviewed feeding cues.  Mother is familiar with hand expression and will feed back any EBM she obtains with hand expression.  Suggested mother feed STS and hand express prior to latching.  Mom made aware of O/P services, breastfeeding support groups, community resources, and our phone # for post-discharge questions. Mother will be a "stay at home" mother and has a DEBP for home use.  Father present.  Mother will call for latch assistance as needed.   Maternal Data Formula Feeding for Exclusion: No Has patient been taught Hand Expression?: Yes  Feeding Feeding Type: Breast Fed  LATCH Score Latch: Grasps breast easily, tongue down, lips flanged, rhythmical sucking.  Audible Swallowing: A few with stimulation  Type of Nipple: Everted at rest and after stimulation  Comfort (Breast/Nipple): Soft / non-tender  Hold (Positioning): Assistance needed to correctly position infant at breast and maintain latch.  LATCH Score: 8  Interventions    Lactation Tools Discussed/Used     Consult Status Consult Status: Follow-up Date: 08/04/18 Follow-up type: In-patient    Stacy Wall [name] Patient 08/03/2018, 5:07 PM

## 2018-08-04 ENCOUNTER — Encounter (HOSPITAL_COMMUNITY): Payer: Self-pay | Admitting: *Deleted

## 2018-08-04 ENCOUNTER — Telehealth: Payer: Self-pay | Admitting: *Deleted

## 2018-08-04 DIAGNOSIS — Z3A40 40 weeks gestation of pregnancy: Secondary | ICD-10-CM

## 2018-08-04 DIAGNOSIS — O480 Post-term pregnancy: Secondary | ICD-10-CM

## 2018-08-04 LAB — CBC
HEMATOCRIT: 25.8 % — AB (ref 36.0–46.0)
Hemoglobin: 8.6 g/dL — ABNORMAL LOW (ref 12.0–15.0)
MCH: 30.3 pg (ref 26.0–34.0)
MCHC: 33.3 g/dL (ref 30.0–36.0)
MCV: 90.8 fL (ref 80.0–100.0)
Platelets: 180 10*3/uL (ref 150–400)
RBC: 2.84 MIL/uL — ABNORMAL LOW (ref 3.87–5.11)
RDW: 13.9 % (ref 11.5–15.5)
WBC: 9.8 10*3/uL (ref 4.0–10.5)
nRBC: 0 % (ref 0.0–0.2)

## 2018-08-04 MED ORDER — IBUPROFEN 600 MG PO TABS: 600.0000 mg | ORAL_TABLET | Freq: Four times a day (QID) | ORAL | 1 refills | Status: DC

## 2018-08-04 MED ORDER — NORETHINDRONE 0.35 MG PO TABS: 1.0000 | ORAL_TABLET | Freq: Every day | ORAL | 3 refills | Status: DC

## 2018-08-04 MED ORDER — SODIUM CHLORIDE 0.9 % IV SOLN
510.0000 mg | Freq: Once | INTRAVENOUS | Status: AC
  Administered 2018-08-04: 510 mg via INTRAVENOUS
  Filled 2018-08-04: qty 17

## 2018-08-04 MED ORDER — SENNOSIDES-DOCUSATE SODIUM 8.6-50 MG PO TABS: 2.0000 | ORAL_TABLET | ORAL | 0 refills | Status: DC

## 2018-08-04 MED ORDER — FERROUS SULFATE 325 (65 FE) MG PO TABS: 325.0000 mg | ORAL_TABLET | Freq: Two times a day (BID) | ORAL | 3 refills | Status: DC

## 2018-08-04 NOTE — Telephone Encounter (Signed)
Lmom for pt to call us back to schedule pp appt.  08-04-2018  AS

## 2018-08-04 NOTE — Progress Notes (Signed)
CSW acknowledged consult and completed clinical assessment.  Clinical documentation will follow.  There are no barriers to d/c.  Maryna Yeagle, LCSWA Clinical Social Worker [facility] Cell#: [phone]    

## 2018-08-04 NOTE — Discharge Summary (Signed)
OB Discharge Summary     Patient Name: Stacy Wall DOB: 11-Nov-1990 MRN: [id number]  Date of admission: 08/02/2018 Delivering MD: Myrene Buddy   Date of discharge: 08/04/2018  Admitting diagnosis: 40.4 WKS, CTXS, BLEEDING Intrauterine pregnancy: [redacted]w[redacted]d     Secondary diagnosis:  Active Problems:   Normal labor  Additional problems:  History of HSV Limited PNC, 1 visit at FT  PP Hemorrhage      Discharge diagnosis: Term Pregnancy Delivered                                                                                                Post partum procedures:None  Augmentation: Pitocin and Cytotec  Complications: Hemorrhage>1041mL  Hospital course:  Onset of Labor With Vaginal Delivery     28 y.o. yo F1T0211 at [redacted]w[redacted]d was admitted in Latent Labor on 08/02/2018. Patient had an uncomplicated labor course as follows:  Membrane Rupture Time/Date: 5:06 AM ,08/03/2018   Intrapartum Procedures: Episiotomy: None [1]                                         Lacerations:  None [1]  Patient had a delivery of a Viable infant. 08/03/2018  Information for the patient's newborn:  Carey, Lone [[id number]]  Delivery Method: Vaginal, Spontaneous(Filed from Delivery Summary)  Delivery complicated by [facility] with 1100 cc of blood loss.   Pateint had an uncomplicated postpartum course.  She is ambulating, tolerating a regular diet, passing flatus, and urinating well. Given drop in HgB from 12.8 to 8.6, patient was given Feraheme prior to d/c and Rx for PO ferrous sulfate was sent to pharmacy. Patient is discharged home in stable condition on 08/04/18.   Physical exam  Vitals:   08/03/18 1710 08/03/18 2100 08/03/18 2309 08/04/18 0500  BP: 119/77 124/72 133/81 110/63  Pulse: 83 80 84 71  Resp: 18 18 16 16   Temp: 98.2 F (36.8 C) 98 F (36.7 C) 97.9 F (36.6 C) 98.1 F (36.7 C)  TempSrc: Oral Oral Oral Oral  SpO2:   99% 100%  Weight:      Height:       General: alert and  cooperative Lochia: appropriate Uterine Fundus: firm Incision: N/A DVT Evaluation: No evidence of DVT seen on physical exam. Labs: Lab Results  Component Value Date   WBC 9.8 08/04/2018   HGB 8.6 (L) 08/04/2018   HCT 25.8 (L) 08/04/2018   MCV 90.8 08/04/2018   PLT 180 08/04/2018   CMP Latest Ref Rng & Units 02/12/2018  Glucose 70 - 99 mg/dL 98  BUN 6 - 20 mg/dL 6  Creatinine 1.03 - 0.13 mg/dL 1.43(O)  Sodium 887 - 579 mmol/L 136  Potassium 3.5 - 5.1 mmol/L 3.8  Chloride 98 - 111 mmol/L 104  CO2 22 - 32 mmol/L 25  Calcium 8.9 - 10.3 mg/dL 9.1  Total Protein 6.5 - 8.1 g/dL 6.7  Total Bilirubin 0.3 - 1.2 mg/dL 7.2(Q)  Alkaline Phos 38 - 126 U/L 69  AST 15 - 41 U/L 22  ALT 0 - 44 U/L 24    Discharge instruction: per After Visit Summary and "Baby and Me Booklet".  After visit meds:  Allergies as of 08/04/2018      Reactions   Hydrocodone Rash      Medication List    TAKE these medications   ferrous sulfate 325 (65 FE) MG tablet Take 1 tablet (325 mg total) by mouth 2 (two) times daily with a meal.   ibuprofen 600 MG tablet Commonly known as:  ADVIL,MOTRIN Take 1 tablet (600 mg total) by mouth every 6 (six) hours.   norethindrone 0.35 MG tablet Commonly known as:  MICRONOR,CAMILA,ERRIN Take 1 tablet (0.35 mg total) by mouth daily. Start taking on:  August 17, 2018   senna-docusate 8.6-50 MG tablet Commonly known as:  Senokot-S Take 2 tablets by mouth daily. Start taking on:  August 05, 2018       Diet: routine diet  Activity: Advance as tolerated. Pelvic rest for 6 weeks.   Outpatient follow up:4 weeks Follow up Appt:No future appointments. Follow up Visit:No follow-ups on file.  Postpartum contraception: Progesterone only pills and Vasectomy  Newborn Data: Live born female  Birth Weight: 6 lb 13 oz (3090 g) APGAR: 8, 9  Newborn Delivery   Birth date/time:  08/03/2018 06:27:00 Delivery type:  Vaginal, Spontaneous     Baby Feeding:  Breast Disposition:home with mother   08/04/2018 De Hollingshead, DO

## 2018-08-04 NOTE — Discharge Instructions (Signed)
Braxton Hicks Contractions Contractions of the uterus can occur throughout pregnancy, but they are not always a sign that you are in labor. You may have practice contractions called Braxton Hicks contractions. These false labor contractions are sometimes confused with true labor. What are Braxton Hicks contractions? Braxton Hicks contractions are tightening movements that occur in the muscles of the uterus before labor. Unlike true labor contractions, these contractions do not result in opening (dilation) and thinning of the cervix. Toward the end of pregnancy (32-34 weeks), Braxton Hicks contractions can happen more often and may become stronger. These contractions are sometimes difficult to tell apart from true labor because they can be very uncomfortable. You should not feel embarrassed if you go to the hospital with false labor. Sometimes, the only way to tell if you are in true labor is for your health care provider to look for changes in the cervix. The health care provider will do a physical exam and may monitor your contractions. If you are not in true labor, the exam should show that your cervix is not dilating and your water has not broken. If there are no other health problems associated with your pregnancy, it is completely safe for you to be sent home with false labor. You may continue to have Braxton Hicks contractions until you go into true labor. How to tell the difference between true labor and false labor True labor  Contractions last 30-70 seconds.  Contractions become very regular.  Discomfort is usually felt in the top of the uterus, and it spreads to the lower abdomen and low back.  Contractions do not go away with walking.  Contractions usually become more intense and increase in frequency.  The cervix dilates and gets thinner. False labor  Contractions are usually shorter and not as strong as true labor contractions.  Contractions are usually irregular.  Contractions  are often felt in the front of the lower abdomen and in the groin.  Contractions may go away when you walk around or change positions while lying down.  Contractions get weaker and are shorter-lasting as time goes on.  The cervix usually does not dilate or become thin. Follow these instructions at home:   Take over-the-counter and prescription medicines only as told by your health care provider.  Keep up with your usual exercises and follow other instructions from your health care provider.  Eat and drink lightly if you think you are going into labor.  If Braxton Hicks contractions are making you uncomfortable: ? Change your position from lying down or resting to walking, or change from walking to resting. ? Sit and rest in a tub of warm water. ? Drink enough fluid to keep your urine pale yellow. Dehydration may cause these contractions. ? Do slow and deep breathing several times an hour.  Keep all follow-up prenatal visits as told by your health care provider. This is important. Contact a health care provider if:  You have a fever.  You have continuous pain in your abdomen. Get help right away if:  Your contractions become stronger, more regular, and closer together.  You have fluid leaking or gushing from your vagina.  You pass blood-tinged mucus (bloody show).  You have bleeding from your vagina.  You have low back pain that you never had before.  You feel your baby's head pushing down and causing pelvic pressure.  Your baby is not moving inside you as much as it used to. Summary  Contractions that occur before labor are   called Braxton Hicks contractions, false labor, or practice contractions.  Braxton Hicks contractions are usually shorter, weaker, farther apart, and less regular than true labor contractions. True labor contractions usually become progressively stronger and regular, and they become more frequent.  Manage discomfort from Braxton Hicks contractions  by changing position, resting in a warm bath, drinking plenty of water, or practicing deep breathing. This information is not intended to replace advice given to you by your health care provider. Make sure you discuss any questions you have with your health care provider. Document Released: 11/28/2016 Document Revised: 04/29/2017 Document Reviewed: 11/28/2016 Elsevier Interactive Patient Education  2019 [employer]  

## 2018-08-04 NOTE — Progress Notes (Signed)
Post Partum Day 1 Subjective: no complaints, voiding, tolerating PO and + flatus. Patient reports vaginal bleeding that required approximately 6 pads last night.   Objective: Blood pressure 110/63, pulse 71, temperature 98.1 F (36.7 C), temperature source Oral, resp. rate 16, height 5\' 5"  (1.651 m), weight 86.2 kg, SpO2 100 %.  Physical Exam:  General: alert, cooperative, fatigued and no distress Lochia: appropriate DVT Evaluation: No evidence of DVT seen on physical exam.  Recent Labs    08/02/18 1905 08/04/18 0547  HGB 12.8 8.6*  HCT 38.2 25.8*    Assessment/Plan: Discharge home  Start iron supplement for low Hgb and Hct.    LOS: 2 days   Philis Kendall 08/04/2018, 7:48 AM

## 2018-08-05 NOTE — Clinical Social Work Maternal (Signed)
CLINICAL SOCIAL WORK MATERNAL/CHILD NOTE  Patient Details  Name: Stacy Wall MRN: [id number] Date of Birth: 08/27/1990  Date:  08/05/2018  Clinical Social Worker Initiating Note:  Flonnie Wierman      Date/Time: Initiated:  08/04/18/1000     Child's Name:  Stacy Wall   Biological Parents:  Mother, Father(Father - William Wall )   Need for Interpreter:  None   Reason for Referral:  Late or No Prenatal Care    Address:  [address] [place] [address]    Phone number:  [phone] (home)     Additional phone number:   Household Members/Support Persons (HM/SP):   Household Member/Support Person 1, Household Member/Support Person 2, Household Member/Support Person 3   HM/SP Name Relationship DOB or Age  HM/SP -1 William Wall FOB 07/05/1999  HM/SP -2 Talia Reynolds Daughter 01-26-11  HM/SP -3 Tevin Reynolds Son 08-19-09  HM/SP -4     HM/SP -5     HM/SP -6     HM/SP -7     HM/SP -8       Natural Supports (not living in the home): Parent   Professional Supports:None   Employment:Unemployed   Type of Work:     Education:  High school graduate   Homebound arranged:    Financial Resources:Self-Pay    Other Resources: WIC, Food Stamps    Cultural/Religious Considerations Which May Impact Care:   Strengths: Ability to meet basic needs , Home prepared for child , Pediatrician chosen   Psychotropic Medications:         Pediatrician:    [place]  Pediatrician List:   [place]   [place]   [place]   [place] [facility]  [place]   [place]     Pediatrician Fax Number:    Risk Factors/Current Problems: Substance Use    Cognitive State: Able to Concentrate , Alert , Linear Thinking , Goal Oriented    Mood/Affect: Calm , Interested , Happy    CSW Assessment:CSW spoke with [name] at bedside, [name]'s daughter present. [name]  granted CSW verbal permission to speak about anything in front of her daughter. CSW introduced self and reason for consult. CSW informed [name] that her chart only reflected one prenatal visit, [name] reported that she only had a few prenatal visits. CSW and [name] discussed [name]'s living environment. [name] reported that she resides with FOB and two older children. [name] reported that she has all items needed for the baby.   CSW explained hospital drug policy due to limited prenatal care, [name] verbalized understanding. CSW inquired about [name] substance use during pregnancy, [name] denied any substance use. CSW informed [name] that UDS was negative and that CDS would be monitored. CSW explained that if CDS is positive for any illegal substances a report would be made to CPS. CSW did not probe further or ask [name] about positive UDS for THC during pregnancy since her daughter was present. CSW asked [name] if she had any history with CPS. [name] reported that she had an open case years ago when her older two children were born. [name] reported that she currently has no open cases. CSW inquired about [name]'s mental health history, [name] denied any mental health history or any history of Postpartum Depression after two previous births. [name] presented calm and was engaged during assessment. [name] did not demonstrate any acute mental health signs/symptoms. CSW assessed for safety, [name] denied SI, HI and domestic violence.   CSW   provided education regarding the baby blues period vs. perinatal mood disorders, discussed treatment and gave resources for mental health follow up if concerns arise.  CSW recommends self-evaluation during the postpartum time period using the New Mom Checklist from Postpartum Progress and encouraged [name] to contact a medical professional if symptoms are noted at any time.    CSW provided review of Sudden Infant Death Syndrome (SIDS) precautions.    CSW identifies no further need for intervention and no barriers to discharge at  this time.  CSW Plan/Description: No Further Intervention Required/No Barriers to Discharge, Hospital Drug Screen Policy Information, CSW Will Continue to Monitor Umbilical Cord Tissue Drug Screen Results and Make Report if Warranted, Perinatal Mood and Anxiety Disorder (PMADs) Education, Sudden Infant Death Syndrome (SIDS) Education    Ratasha Fabre L Tameka Hoiland, LCSW 08/05/2018, 10:28 AM           

## 2018-08-07 ENCOUNTER — Telehealth: Payer: Self-pay | Admitting: Women's Health

## 2018-08-07 MED ORDER — VALACYCLOVIR HCL 1 G PO TABS: 1000.0000 mg | ORAL_TABLET | Freq: Every day | ORAL | 1 refills | Status: DC

## 2018-08-07 NOTE — Telephone Encounter (Signed)
Informed pt that the prescription for valtrex was sent to her pharmacy. Advised that she should let her baby's pediatrician know that she had an outbreak this close to delivery. Pt states that she has already done so, and the pediatrician explained s/s to watch for in baby.

## 2018-08-07 NOTE — Telephone Encounter (Signed)
Pt called stating that she has had a herpes outbreak since coming home from the hospital after giving birth. Pt is requesting a refill on her medication be sent in. Advised that I would send her request to a provider and she could check with her pharmacy this afternoon.

## 2018-08-07 NOTE — Telephone Encounter (Signed)
Pt is having outbreak/since home from hospital/ can we call something in for her to [place].

## 2018-09-07 ENCOUNTER — Encounter (HOSPITAL_COMMUNITY): Payer: Self-pay | Admitting: Emergency Medicine

## 2018-09-07 ENCOUNTER — Emergency Department (HOSPITAL_COMMUNITY): Payer: Self-pay

## 2018-09-07 ENCOUNTER — Emergency Department (HOSPITAL_COMMUNITY)
Admission: EM | Admit: 2018-09-07 | Discharge: 2018-09-07 | Disposition: A | Discharge status: Home / Self Care | Payer: Self-pay | Attending: Emergency Medicine | Admitting: Emergency Medicine

## 2018-09-07 ENCOUNTER — Other Ambulatory Visit: Payer: Self-pay

## 2018-09-07 DIAGNOSIS — Z79899 Other long term (current) drug therapy: Secondary | ICD-10-CM | POA: Insufficient documentation

## 2018-09-07 DIAGNOSIS — K8020 Calculus of gallbladder without cholecystitis without obstruction: Secondary | ICD-10-CM | POA: Insufficient documentation

## 2018-09-07 DIAGNOSIS — R1013 Epigastric pain: Secondary | ICD-10-CM | POA: Insufficient documentation

## 2018-09-07 LAB — CBC
HCT: 40.7 % (ref 36.0–46.0)
Hemoglobin: 13.2 g/dL (ref 12.0–15.0)
MCH: 29.9 pg (ref 26.0–34.0)
MCHC: 32.4 g/dL (ref 30.0–36.0)
MCV: 92.1 fL (ref 80.0–100.0)
Platelets: 239 10*3/uL (ref 150–400)
RBC: 4.42 MIL/uL (ref 3.87–5.11)
RDW: 13 % (ref 11.5–15.5)
WBC: 9.7 10*3/uL (ref 4.0–10.5)
nRBC: 0 % (ref 0.0–0.2)

## 2018-09-07 LAB — COMPREHENSIVE METABOLIC PANEL
ALT: 36 U/L (ref 0–44)
AST: 29 U/L (ref 15–41)
Albumin: 4.2 g/dL (ref 3.5–5.0)
Alkaline Phosphatase: 110 U/L (ref 38–126)
Anion gap: 10 (ref 5–15)
BUN: 13 mg/dL (ref 6–20)
CO2: 25 mmol/L (ref 22–32)
Calcium: 8.9 mg/dL (ref 8.9–10.3)
Chloride: 103 mmol/L (ref 98–111)
Creatinine, Ser: 0.63 mg/dL (ref 0.44–1.00)
GFR calc Af Amer: >60 mL/min (ref >60)
GFR calc non Af Amer: >60 mL/min (ref >60)
Glucose, Bld: 98 mg/dL (ref 70–99)
Potassium: 3.5 mmol/L (ref 3.5–5.1)
SODIUM: 138 mmol/L (ref 135–145)
Total Bilirubin: <0.1 mg/dL — ABNORMAL LOW (ref 0.3–1.2)
Total Protein: 7.3 g/dL (ref 6.5–8.1)

## 2018-09-07 LAB — URINALYSIS, ROUTINE W REFLEX MICROSCOPIC
Bilirubin Urine: NEGATIVE
Glucose, UA: NEGATIVE mg/dL
Ketones, ur: NEGATIVE mg/dL
Leukocytes, UA: NEGATIVE
Nitrite: NEGATIVE
PH: 7 (ref 5.0–8.0)
Protein, ur: NEGATIVE mg/dL
Specific Gravity, Urine: 1.015 (ref 1.005–1.030)

## 2018-09-07 LAB — URINALYSIS, MICROSCOPIC (REFLEX)
Bacteria, UA: NONE SEEN
Squamous Epithelial / HPF: NONE SEEN (ref 0–5)
WBC, UA: NONE SEEN WBC/hpf (ref 0–5)

## 2018-09-07 LAB — LIPASE, BLOOD: Lipase: 30 U/L (ref 11–51)

## 2018-09-07 LAB — PREGNANCY, URINE: Preg Test, Ur: NEGATIVE

## 2018-09-07 MED ORDER — ALUM & MAG HYDROXIDE-SIMETH 200-200-20 MG/5ML PO SUSP
30.0000 mL | Freq: Once | ORAL | Status: AC
  Administered 2018-09-07: 30 mL via ORAL
  Filled 2018-09-07: qty 30

## 2018-09-07 MED ORDER — FAMOTIDINE 20 MG PO TABS
20.0000 mg | ORAL_TABLET | Freq: Once | ORAL | Status: AC
  Administered 2018-09-07: 20 mg via ORAL
  Filled 2018-09-07: qty 1

## 2018-09-07 MED ORDER — FAMOTIDINE 20 MG PO TABS: 40.0000 mg | ORAL_TABLET | Freq: Once | ORAL | Status: DC

## 2018-09-07 NOTE — Discharge Instructions (Addendum)
Eat a bland diet, avoiding greasy, fatty, fried foods, as well as spicy and acidic foods or beverages.  Avoid eating within 2 to 3 hours before going to bed or laying down.  Also avoid teas, colas, coffee, chocolate, pepermint and spearment.  May take over the counter maalox/mylanta, as directed on packaging, as needed for discomfort. Take your usual prescriptions as previously directed.  Call your regular medical doctor today to schedule a follow up appointment this week.  Call the General Surgeon today to schedule a follow up appointment in the next week. Return to the Emergency Department immediately if worsening.

## 2018-09-07 NOTE — ED Triage Notes (Signed)
Pt states that she has epigastric pain at a a level of 10 since 0500 this morning.

## 2018-09-07 NOTE — ED Provider Notes (Signed)
[facility] EMERGENCY DEPARTMENT Provider Note   CSN: [id number] Arrival date & time: 09/07/18  1023     History   Chief Complaint Chief Complaint  Patient presents with  . Abdominal Pain    HPI Stacy Wall is a 28 y.o. female.  HPI  Pt was seen at 1140.  Per pt, c/o gradual onset and persistence of constant mid-epigastric abd "pain" since 0500 this morning. Pt states she was "up with the baby" when her symptoms began.  Has been associated with no other symptoms.  Describes the abd pain as "sharp." Has not taken any medication to treat her pain.  Denies N/V, no diarrhea, no fevers, no back pain, no rash, no CP/SOB, no black or blood in stools.      Past Medical History:  Diagnosis Date  . Chronic back pain   . Gallstones   . Herpes     Patient Active Problem List   Diagnosis Date Noted  . Normal labor 08/02/2018  . Supervision of normal pregnancy 04/28/2018  . Late prenatal care in second trimester 04/28/2018  . 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 NO PAST SURGERIES       OB History    Gravida  3   Para  2   Term  2   Preterm      AB      Living  2     SAB      TAB      Ectopic      Multiple      Live Births  2            Home Medications    Prior to Admission medications   Medication Sig Start Date End Date Taking? Authorizing Provider  ferrous sulfate 325 (65 FE) MG tablet Take 1 tablet (325 mg total) by mouth 2 (two) times daily with a meal. 08/04/18 12/02/18  Arvilla Market, DO  ibuprofen (ADVIL,MOTRIN) 600 MG tablet Take 1 tablet (600 mg total) by mouth every 6 (six) hours. 08/04/18   Arvilla Market, DO  norethindrone (MICRONOR,CAMILA,ERRIN) 0.35 MG tablet Take 1 tablet (0.35 mg total) by mouth daily. 08/17/18   Arvilla Market, DO  senna-docusate (SENOKOT-S) 8.6-50 MG tablet Take 2 tablets by mouth daily. 08/05/18   Arvilla Market, DO  valACYclovir (VALTREX) 1000 MG tablet Take 1 tablet (1,000 mg total) by mouth daily. 08/07/18   Cheral Marker, CNM    Family History Family History  Problem Relation Age of Onset  . Diabetes Maternal Aunt   . Cancer Maternal Grandmother        breast, ovarian  . Cancer Maternal Grandfather        colon    Social History Social History   Tobacco Use  . Smoking status: Never Smoker  . Smokeless tobacco: Never Used  Substance Use Topics  . Alcohol use: No  . Drug use: No     Allergies   Hydrocodone   Review of Systems Review of Systems ROS: Statement: All systems negative except as marked or noted in the HPI; Constitutional: Negative for fever and chills. ; ; Eyes: Negative for eye pain, redness and discharge. ; ; ENMT: Negative for ear pain, hoarseness, nasal congestion, sinus pressure and sore throat. ; ; Cardiovascular: Negative for chest pain, palpitations, diaphoresis, dyspnea and peripheral edema. ; ; Respiratory: Negative for cough, wheezing and stridor. ; ;  Gastrointestinal: +abd pain. Negative for nausea, vomiting, diarrhea, blood in stool, hematemesis, jaundice and rectal bleeding. . ; ; Genitourinary: Negative for dysuria, flank pain and hematuria. ; ; Musculoskeletal: Negative for back pain and neck pain. Negative for swelling and trauma.; ; Skin: Negative for pruritus, rash, abrasions, blisters, bruising and skin lesion.; ; Neuro: Negative for headache, lightheadedness and neck stiffness. Negative for weakness, altered level of consciousness, altered mental status, extremity weakness, paresthesias, involuntary movement, seizure and syncope.       Physical Exam Updated Vital Signs BP 130/74 (BP Location: Right Arm)   Pulse 75   Temp (!) 97.5 F (36.4 C) (Oral)   Resp 18   Ht 5\' 5"  (1.651 m)   Wt 72.6 kg   SpO2 100%   BMI 26.63 kg/m   Physical Exam 1145: Physical examination:  Nursing notes reviewed; Vital signs and O2 SAT  reviewed;  Constitutional: Well developed, Well nourished, Well hydrated, In no acute distress; Head:  Normocephalic, atraumatic; Eyes: EOMI, PERRL, No scleral icterus; ENMT: Mouth and pharynx normal, Mucous membranes moist; Neck: Supple, Full range of motion, No lymphadenopathy; Cardiovascular: Regular rate and rhythm, No gallop; Respiratory: Breath sounds clear & equal bilaterally, No wheezes.  Speaking full sentences with ease, Normal respiratory effort/excursion; Chest: Nontender, Movement normal; Abdomen: Soft, +mid-epigastric tenderness to palp. No rebound or guarding. Nondistended, Normal bowel sounds; Genitourinary: No CVA tenderness; Extremities: Peripheral pulses normal, No tenderness, No edema, No calf edema or asymmetry.; Neuro: AA&Ox3, Major CN grossly intact.  Speech clear. No gross focal motor or sensory deficits in extremities.; Skin: Color normal, Warm, Dry.   ED Treatments / Results  Labs (all labs ordered are listed, but only abnormal results are displayed)   EKG None  Radiology   Procedures Procedures (including critical care time)  Medications Ordered in ED Medications - No data to display   Initial Impression / Assessment and Plan / ED Course  I have reviewed the triage vital signs and the nursing notes.  Pertinent labs & imaging results that were available during my care of the patient were reviewed by me and considered in my medical decision making (see chart for details).  MDM Reviewed: previous chart, nursing note and vitals Reviewed previous: labs Interpretation: labs, x-ray and ultrasound     Results for orders placed or performed during the hospital encounter of 09/07/18  Lipase, blood  Result Value Ref Range   Lipase 30 11 - 51 U/L  Comprehensive metabolic panel  Result Value Ref Range   Sodium 138 135 - 145 mmol/L   Potassium 3.5 3.5 - 5.1 mmol/L   Chloride 103 98 - 111 mmol/L   CO2 25 22 - 32 mmol/L   Glucose, Bld 98 70 - 99 mg/dL   BUN 13  6 - 20 mg/dL   Creatinine, Ser 0.980.63 0.44 - 1.00 mg/dL   Calcium 8.9 8.9 - 11.910.3 mg/dL   Total Protein 7.3 6.5 - 8.1 g/dL   Albumin 4.2 3.5 - 5.0 g/dL   AST 29 15 - 41 U/L   ALT 36 0 - 44 U/L   Alkaline Phosphatase 110 38 - 126 U/L   Total Bilirubin <0.1 (L) 0.3 - 1.2 mg/dL   GFR calc non Af Amer >60 >60 mL/min   GFR calc Af Amer >60 >60 mL/min   Anion gap 10 5 - 15  CBC  Result Value Ref Range   WBC 9.7 4.0 - 10.5 K/uL   RBC 4.42 3.87 - 5.11 MIL/uL  Hemoglobin 13.2 12.0 - 15.0 g/dL   HCT 11.940.7 14.736.0 - 82.946.0 %   MCV 92.1 80.0 - 100.0 fL   MCH 29.9 26.0 - 34.0 pg   MCHC 32.4 30.0 - 36.0 g/dL   RDW 56.213.0 13.011.5 - 86.515.5 %   Platelets 239 150 - 400 K/uL   nRBC 0.0 0.0 - 0.2 %  Urinalysis, Routine w reflex microscopic  Result Value Ref Range   Color, Urine YELLOW YELLOW   APPearance CLEAR CLEAR   Specific Gravity, Urine 1.015 1.005 - 1.030   pH 7.0 5.0 - 8.0   Glucose, UA NEGATIVE NEGATIVE mg/dL   Hgb urine dipstick SMALL (A) NEGATIVE   Bilirubin Urine NEGATIVE NEGATIVE   Ketones, ur NEGATIVE NEGATIVE mg/dL   Protein, ur NEGATIVE NEGATIVE mg/dL   Nitrite NEGATIVE NEGATIVE   Leukocytes, UA NEGATIVE NEGATIVE  Pregnancy, urine  Result Value Ref Range   Preg Test, Ur NEGATIVE NEGATIVE  Urinalysis, Microscopic (reflex)  Result Value Ref Range   RBC / HPF 0-5 0 - 5 RBC/hpf   WBC, UA NONE SEEN 0 - 5 WBC/hpf   Bacteria, UA NONE SEEN NONE SEEN   Squamous Epithelial / LPF NONE SEEN 0 - 5   [place] Abdomen Complete Result Date: 09/07/2018 CLINICAL DATA:  Epigastric pain since this morning. EXAM: ABDOMEN ULTRASOUND COMPLETE COMPARISON:  None. FINDINGS: Gallbladder: Multiple gallstones and/or tumefactive sludge within the gallbladder. Largest measurable stone is 1.2 cm. No gallbladder wall thickening, pericholecystic fluid or sonographic Murphy's sign. Common bile duct: Diameter: 4 mm Liver: No focal lesion identified. Within normal limits in parenchymal echogenicity. Portal vein is patent on color  Doppler imaging with normal direction of blood flow towards the liver. IVC: No abnormality visualized. Pancreas: Visualized portion unremarkable. Spleen: Size and appearance within normal limits. Right Kidney: Length: 11.8 cm. Echogenicity within normal limits. No mass or hydronephrosis visualized. Left Kidney: Length: 11.8 cm. Echogenicity within normal limits. No mass or hydronephrosis visualized. Abdominal aorta: No aneurysm visualized. Aortic bifurcation obscured by overlying bowel gas. Other findings: None. IMPRESSION: 1. Cholelithiasis without evidence of acute cholecystitis. 2. No acute findings. Electronically Signed   By: Bary RichardStan  Maynard M.D.   On: 09/07/2018 12:53   Dg Abd Acute W/chest Result Date: 09/07/2018 CLINICAL DATA:  Upper abdominal pain EXAM: DG ABDOMEN ACUTE W/ 1V CHEST COMPARISON:  Abdomen series February 29, 2016 FINDINGS: PA chest: Lungs are clear. Heart size and pulmonary vascularity are normal. No adenopathy. Supine and upright abdomen: There is diffuse stool throughout colon. There is no bowel dilatation or air-fluid level to suggest bowel obstruction. No free air. There are phleboliths in the pelvis. IMPRESSION: Diffuse stool throughout colon. Question a degree of constipation. No bowel obstruction or free air. Lungs clear. Electronically Signed   By: Bretta BangWilliam  Woodruff III M.D.   On: 09/07/2018 13:13    1330:  Pt has tol PO well while in the ED without N/V.  No stooling while in the ED.  Abd benign, resps easy, VSS. Feels better after meds and wants to go home now. Workup reassuring. Tx symptomatically at this time. Dx and testing d/w pt and family.  Questions answered.  Verb understanding, agreeable to d/c home with outpt f/u.    Final Clinical Impressions(s) / ED Diagnoses   Final diagnoses:  None    ED Discharge Orders    None       Samuel JesterMcManus, Infiniti Hoefling, DO 09/09/18 1859

## 2018-09-14 ENCOUNTER — Encounter: Payer: Self-pay | Admitting: *Deleted

## 2018-09-14 ENCOUNTER — Ambulatory Visit: Payer: Self-pay | Admitting: Women's Health

## 2019-06-13 IMAGING — DX DG ABDOMEN ACUTE W/ 1V CHEST
3 series · 3 of 3 positions shown · non-contrast
Comparison: Abdomen series February 29, 2016

CLINICAL DATA: Upper abdominal pain

EXAM:
DG ABDOMEN ACUTE W/ 1V CHEST

[chest pa]
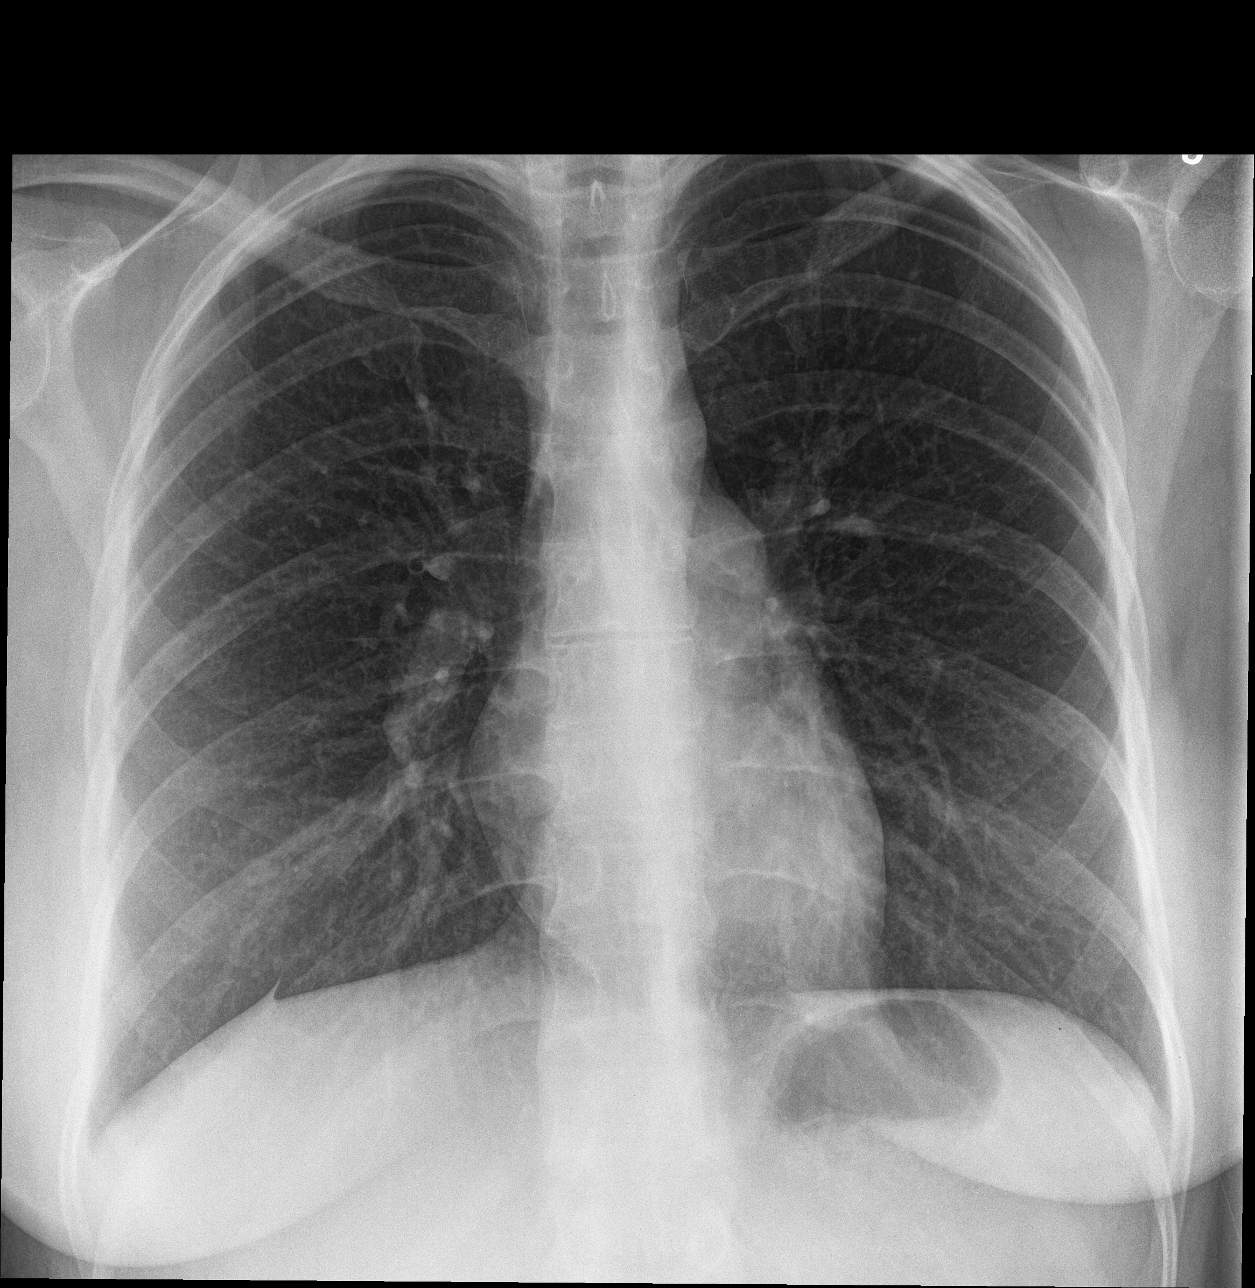

[abdomen erect]
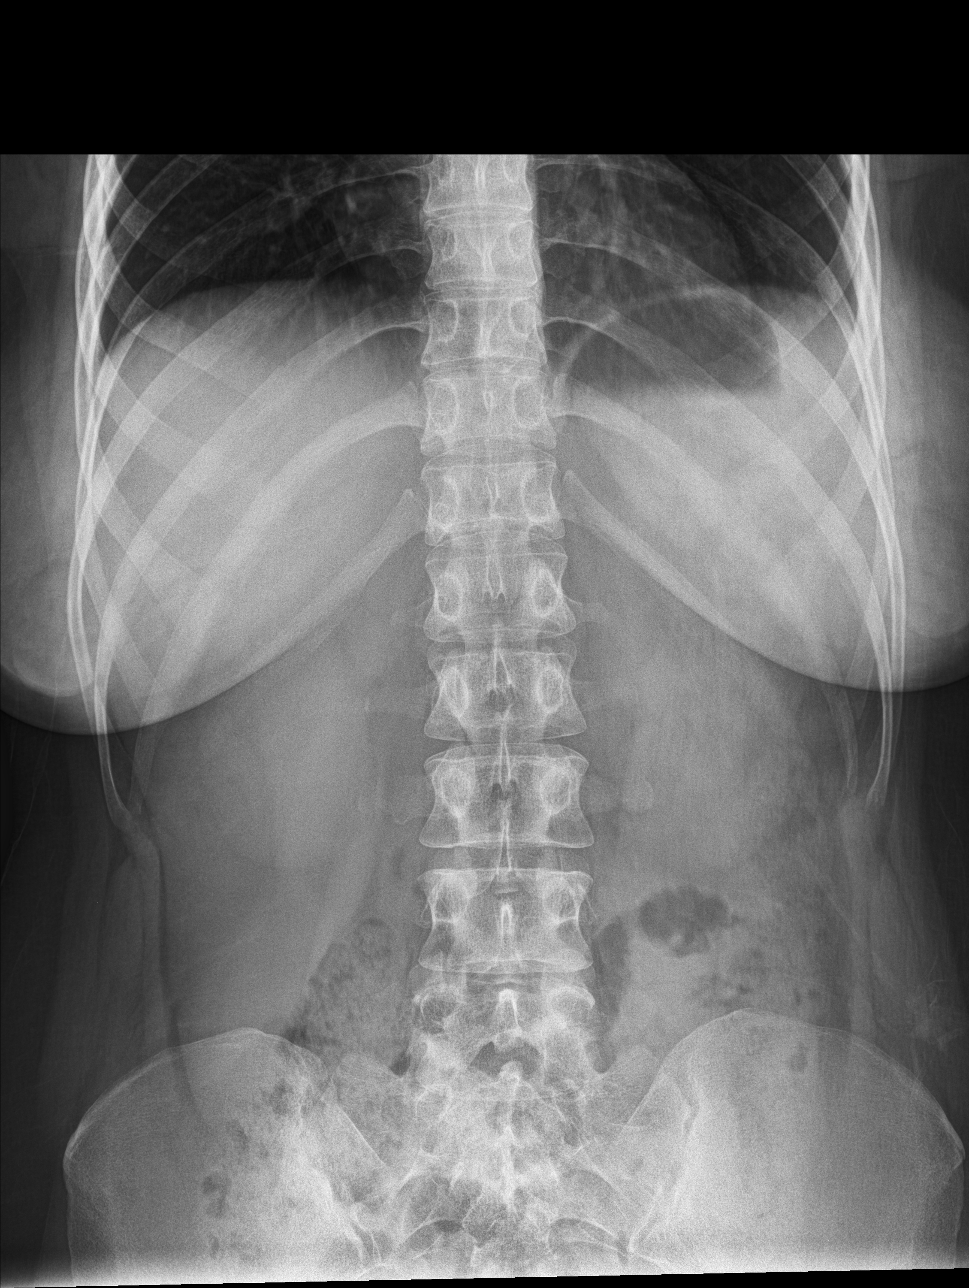

[abdomen supine]
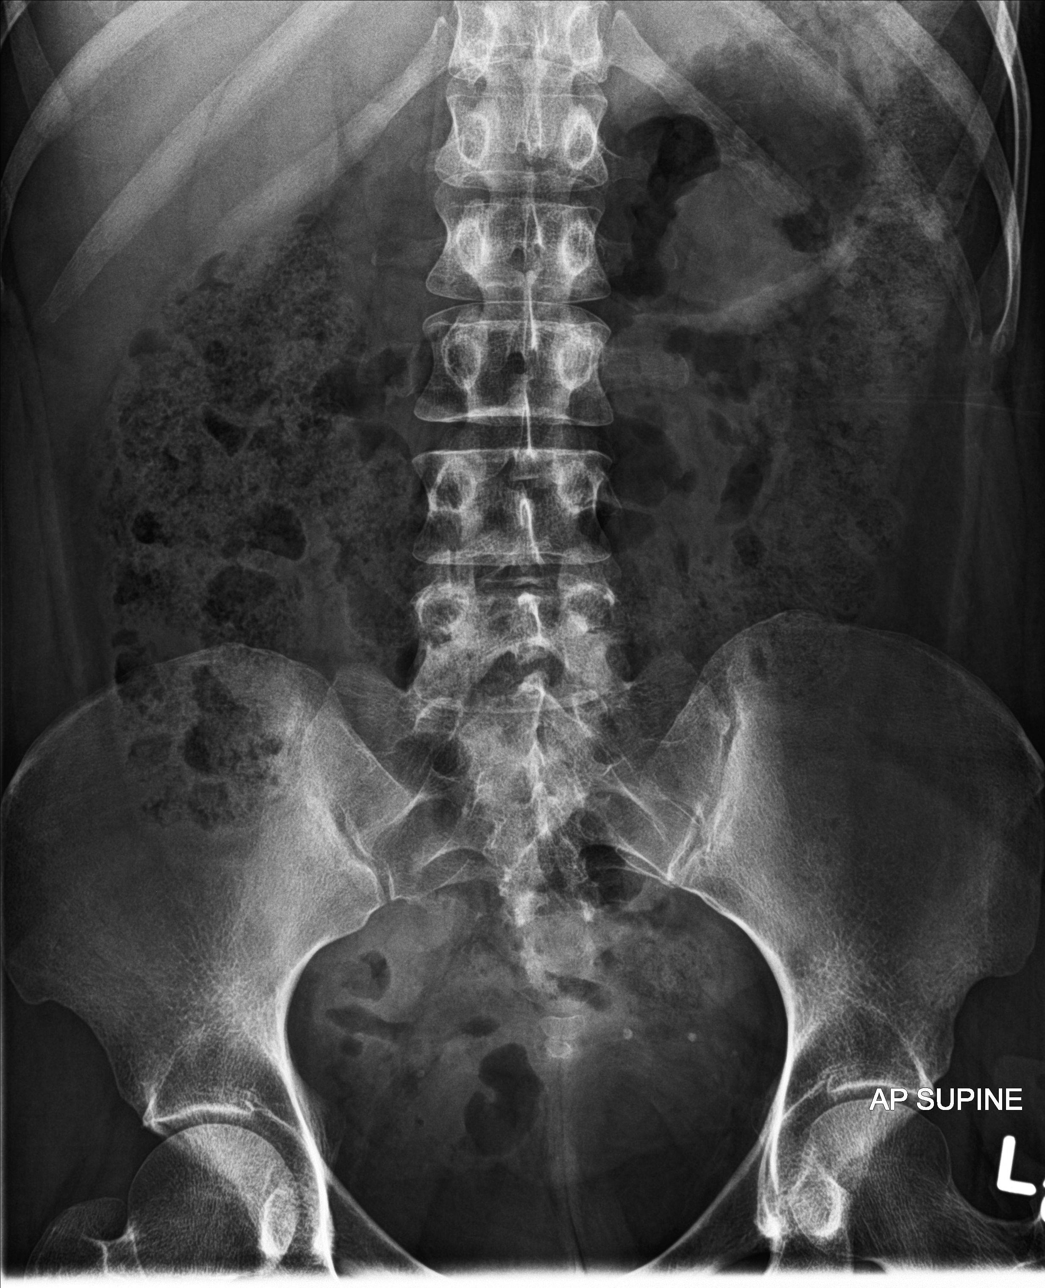

[3 of 3 positions shown; findings below may reference images not displayed]

FINDINGS: PA chest: Lungs are clear. Heart size and pulmonary vascularity are
normal. No adenopathy.

Supine and upright abdomen: There is diffuse stool throughout colon.
There is no bowel dilatation or air-fluid level to suggest bowel
obstruction. No free air. There are phleboliths in the pelvis.
IMPRESSION: Diffuse stool throughout colon. Question a degree of constipation.
No bowel obstruction or free air. Lungs clear.

## 2019-12-28 ENCOUNTER — Other Ambulatory Visit: Payer: Self-pay

## 2019-12-28 ENCOUNTER — Emergency Department (HOSPITAL_COMMUNITY)
Admission: EM | Admit: 2019-12-28 | Discharge: 2019-12-28 | Disposition: A | Discharge status: Home / Self Care | Payer: Self-pay | Attending: Emergency Medicine | Admitting: Emergency Medicine

## 2019-12-28 ENCOUNTER — Emergency Department (HOSPITAL_COMMUNITY): Payer: Self-pay

## 2019-12-28 DIAGNOSIS — Y929 Unspecified place or not applicable: Secondary | ICD-10-CM | POA: Insufficient documentation

## 2019-12-28 DIAGNOSIS — Y999 Unspecified external cause status: Secondary | ICD-10-CM | POA: Insufficient documentation

## 2019-12-28 DIAGNOSIS — Y939 Activity, unspecified: Secondary | ICD-10-CM | POA: Insufficient documentation

## 2019-12-28 DIAGNOSIS — S90851A Superficial foreign body, right foot, initial encounter: Secondary | ICD-10-CM | POA: Insufficient documentation

## 2019-12-28 DIAGNOSIS — W208XXA Other cause of strike by thrown, projected or falling object, initial encounter: Secondary | ICD-10-CM | POA: Insufficient documentation

## 2019-12-28 MED ORDER — LIDOCAINE-EPINEPHRINE (PF) 2 %-1:200000 IJ SOLN: 10.0000 mL | Freq: Once | INTRAMUSCULAR | Status: DC

## 2019-12-28 MED ORDER — LIDOCAINE-EPINEPHRINE (PF) 2 %-1:200000 IJ SOLN
INTRAMUSCULAR | Status: AC
  Filled 2019-12-28: qty 10

## 2019-12-28 NOTE — ED Provider Notes (Signed)
[facility] EMERGENCY DEPARTMENT Provider Note   CSN: [id number] Arrival date & time: 12/28/19  1250     History Chief Complaint  Patient presents with  . Foot Injury    right    Stacy Wall is a 29 y.o. female with no pertinent past medical history presents today for concern of glass in her right foot.  She reports that 4 days ago she dropped a plate and stepped on it with her right heel.  Her family member was able to get part of the glass out of her foot however not all of it.  She reports her last tetanus shot was within the past 5 years.  She states that it hurts only when she walks on it.  She denies any fevers.  She was not wearing shoes when this happened.    HPI     Past Medical History:  Diagnosis Date  . Chronic back pain   . Gallstones   . Herpes     Patient Active Problem List   Diagnosis Date Noted  . Normal labor 08/02/2018  . Supervision of normal pregnancy 04/28/2018  . Late prenatal care in second trimester 04/28/2018  . 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 NO PAST SURGERIES       OB History    Gravida  3   Para  2   Term  2   Preterm      AB      Living  2     SAB      TAB      Ectopic      Multiple      Live Births  2           Family History  Problem Relation Age of Onset  . Diabetes Maternal Aunt   . Cancer Maternal Grandmother        breast, ovarian  . Cancer Maternal Grandfather        colon    Social History   Tobacco Use  . Smoking status: Never Smoker  . Smokeless tobacco: Never Used  Substance Use Topics  . Alcohol use: No  . Drug use: No    Home Medications Prior to Admission medications   Not on File    Allergies    Hydrocodone  Review of Systems   Review of Systems  Constitutional: Negative for chills and fever.  Skin: Positive for wound.  All other systems reviewed and are negative.   Physical  Exam Updated Vital Signs BP 112/73 (BP Location: Right Arm)   Pulse 79   Temp 97.9 F (36.6 C) (Oral)   Resp 18   Ht 5\' 5"  (1.651 m)   Wt 59 kg   SpO2 98%   Breastfeeding Yes   BMI 21.63 kg/m   Physical Exam Vitals and nursing note reviewed.  Constitutional:      General: She is not in acute distress.    Appearance: She is not ill-appearing.  HENT:     Head: Normocephalic.  Cardiovascular:     Rate and Rhythm: Normal rate.  Pulmonary:     Effort: Pulmonary effort is normal. No respiratory distress.  Skin:    Comments: On the heal of the right foot there is a pinpoint dark area with skin break.  No abnormal drainage, surrounding erythema or induration.  Neurological:     Mental Status: She is alert.  Sensory: No sensory deficit.     ED Results / Procedures / Treatments   Labs (all labs ordered are listed, but only abnormal results are displayed) Labs Reviewed - No data to display  EKG None  Radiology DG Os Calcis Right  Result Date: 12/28/2019 CLINICAL DATA:  Stepped on glass several days ago. Question remaining fragment. EXAM: RIGHT OS CALCIS - 2+ VIEW COMPARISON:  None. FINDINGS: There does appear to be a very tiny linear density associated with the skin at the heel as marked by the arrow on the lateral view. This does not appear to penetrate into the deeper soft tissues. IMPRESSION: Possible tiny glass fragment projected over the dermal layer at the plantar aspect as marked by the arrow. Electronically Signed   By: Nelson Chimes M.D.   On: 12/28/2019 15:11    Procedures .Foreign Body Removal  Date/Time: 12/28/2019 3:50 PM Performed by: Lorin Glass, PA-C Authorized by: Lorin Glass, PA-C  Consent: Verbal consent obtained. Risks and benefits: risks, benefits and alternatives were discussed Consent given by: patient Patient understanding: patient states understanding of the procedure being performed Relevant documents: relevant documents present  and verified Imaging studies: imaging studies available Body area: skin General location: lower extremity Location details: right foot Anesthesia method: None needed.  Sedation: Patient sedated: no  Patient restrained: no Patient cooperative: yes Localization method: visualized Removal mechanism: Skin was scraped with a scalpel after cleansing with chlorhexadine swabs. Dressing: dressing applied Tendon involvement: none Depth: subcutaneous Complexity: simple 1 objects recovered. Objects recovered: 1 small (approx 63mm) curved piece of clear glass Post-procedure assessment: foreign body removed Patient tolerance: patient tolerated the procedure well with no immediate complications Comments: We discussed at length the high risk of not being able to remove all of the foreign bodies, residual foreign bodies, infection, need for additional procedures.  Patient wished to proceed with attempt at removal Object was superficial in the dermal layer, did not extend into sensate or vascularized tissue   (including critical care time)  Medications Ordered in ED Medications  lidocaine-EPINEPHrine (XYLOCAINE W/EPI) 2 %-1:200000 (PF) injection 10 mL (10 mLs Infiltration Not Given 12/28/19 1518)    ED Course  I have reviewed the triage vital signs and the nursing notes.  Pertinent labs & imaging results that were available during my care of the patient were reviewed by me and considered in my medical decision making (see chart for details).    MDM Rules/Calculators/A&P                     Patient is a 29 year old otherwise healthy woman who presents today for concern of glass in her right heel.  X-ray was obtained showing a small fragment consistent with the location of the reported glass.  I had a lengthy discussion with patient about risks, benefits and other options for removal.  Specifically discussed include possible need for additional procedures, infection, retained foreign body, or  inability to remove any or all of the foreign body, she states her understanding of these risks and wished to proceed with attempted bedside removal.  On exam there is no clear evidence of secondary infection.  She was not wearing shoes when this happened therefore no clear indication for Pseudomonas antibiotic coverage.  Area was cleansed with chlorhexidine and a small sliver of glass was able to be removed under direct visualization, upon magnification of the x-rays obtained the sliver of glass appears to be the same size and shape.  After this  patient was able to ambulate without pain or feeling of residual sharp discomfort.  Removal did not require entering sensate or vascularized tissue, was in a callus/dermis.  Wound is bandaged.  Wound care is discussed.  I did discuss option for antibiotics with patient, given that she is young and healthy rather than systemic antibiotics she would prefer topical antibiotic ointment with close observation.  She is given orthopedics follow-up.  Return precautions were discussed with patient who states their understanding.  At the time of discharge patient denied any unaddressed complaints or concerns.  Patient is agreeable for discharge home.  Note: Portions of this report may have been transcribed using voice recognition software. Every effort was made to ensure accuracy; however, inadvertent computerized transcription errors may be present  Final Clinical Impression(s) / ED Diagnoses Final diagnoses:  Foreign body in right foot, initial encounter    Rx / DC Orders ED Discharge Orders    None       Norman Clay 12/28/19 1555    Terrilee Files, MD 12/28/19 2157

## 2019-12-28 NOTE — ED Triage Notes (Signed)
C/o glass to right heel.  Glass in floor, family member retrieved part of glass but not all.

## 2019-12-28 NOTE — Discharge Instructions (Signed)
Please keep your foot clean and dry.  Put antibiotic ointment on it twice a day and keep it covered with a Band-Aid until it fully heals.  Do not soak or submerge your foot in water (it it is ok to shower).  If it starts looking infected return to the emergency room.

## 2020-01-14 ENCOUNTER — Inpatient Hospital Stay: Admission: RE | Admit: 2020-01-14 | Payer: Self-pay | Source: Ambulatory Visit

## 2020-07-06 ENCOUNTER — Ambulatory Visit (INDEPENDENT_AMBULATORY_CARE_PROVIDER_SITE_OTHER): Payer: Self-pay

## 2020-07-06 ENCOUNTER — Other Ambulatory Visit: Payer: Self-pay

## 2020-07-06 VITALS — BP 118/70 | HR 85 | Ht 65.0 in | Wt 148.6 lb

## 2020-07-06 DIAGNOSIS — Z3201 Encounter for pregnancy test, result positive: Secondary | ICD-10-CM

## 2020-07-06 LAB — POCT URINE PREGNANCY: Preg Test, Ur: POSITIVE — AB

## 2020-07-06 NOTE — Progress Notes (Addendum)
   NURSE VISIT- PREGNANCY CONFIRMATION   SUBJECTIVE:  Stacy Wall is a 29 y.o. [id number] female at [redacted]w[redacted]d by uncertain LMP of Patient's last menstrual period was 02/12/2020 (approximate). Here for pregnancy confirmation.  Home pregnancy test: positive x 2  She reports nausea and vomiting.  She is not taking prenatal vitamins.    OBJECTIVE:  BP 118/70 (BP Location: Right Arm, Patient Position: Sitting, Cuff Size: Normal)   Pulse 85   Ht 5\' 5"  (1.651 m)   Wt 148 lb 9.6 oz (67.4 kg)   LMP 02/12/2020 (Approximate)   Breastfeeding No   BMI 24.73 kg/m   Appears well, in no apparent distress OB History  Gravida Para Term Preterm AB Living  4 2 2     2   SAB IAB Ectopic Multiple Live Births          2    # Outcome Date GA Lbr Len/2nd Weight Sex Delivery Anes PTL Lv  4 Current           3 Term 12/30/10 [redacted]w[redacted]d  7 lb 3 oz (3.26 kg) F Vag-Spont EPI N LIV  2 Term 08/19/09 [redacted]w[redacted]d  6 lb 3 oz (2.807 kg) M Vag-Spont EPI N LIV  1 Gravida             Results for orders placed or performed in visit on 07/06/20 (from the past 24 hour(s))  POCT urine pregnancy   Collection Time: 07/06/20  2:55 PM  Result Value Ref Range   Preg Test, Ur Positive (A) Negative    ASSESSMENT: Positive pregnancy test, [redacted]w[redacted]d by LMP    PLAN: Schedule for dating ultrasound in 1 week Prenatal vitamins: plans to begin OTC ASAP   Nausea medicines: not currently needed   OB packet given: Yes  Klint Lezcano A Nick Stults  07/06/2020 3:01 PM   Chart reviewed for nurse visit. Agree with plan of care.  [redacted]w[redacted]d, 14/03/2020 07/06/2020 3:21 PM

## 2020-07-29 NOTE — L&D Delivery Note (Addendum)
OB/GYN Faculty Practice Delivery Note  Stacy Wall is a 30 y.o. [id number] s/p SVD at [redacted]w[redacted]d. She was admitted for SOL.   ROM: 0h 19m with meconium stained fluid GBS Status: Unknown Maximum Maternal Temperature: 98.60F  Labor Progress: Pt presented at 7 cm of cervical dilation. She had SROM for meconium stained fluid at 1115. She then had an uncomplicated delivery after a brief second stage as noted below.  Delivery Date/Time: 11/21/20 at 1127 Delivery: Called to room and patient was complete and pushing. Head delivered ROA. No nuchal cord present. Shoulder and body delivered in usual fashion. Infant with spontaneous cry, placed on mother's abdomen, dried and stimulated. Cord clamped x 2 after 1-minute delay, and cut by FOB under my direct supervision. Cord blood drawn. Placenta delivered spontaneously with gentle cord traction. Fundus firm with massage and Pitocin. Labia, perineum, vagina, and cervix were inspected, without evidence of lacerations.  Placenta: 3 vessel cord, Intact, sent to L&D Complications: None Lacerations: None EBL: 100 mL Analgesia: None  Infant: viable female  APGARs 8,9  weight 3110g  Jovita Kussmaul, MD  I was present and gloved for delivery of infant and placenta. I agree with resident's documentation as noted above.  Sheila Oats, MD OB Fellow, Faculty Practice 11/21/2020 2:24 PM

## 2020-07-31 ENCOUNTER — Other Ambulatory Visit: Payer: Self-pay | Admitting: Obstetrics & Gynecology

## 2020-07-31 DIAGNOSIS — Z363 Encounter for antenatal screening for malformations: Secondary | ICD-10-CM

## 2020-08-01 ENCOUNTER — Other Ambulatory Visit: Payer: Self-pay

## 2020-08-01 ENCOUNTER — Ambulatory Visit (INDEPENDENT_AMBULATORY_CARE_PROVIDER_SITE_OTHER): Payer: Self-pay

## 2020-08-01 DIAGNOSIS — Z363 Encounter for antenatal screening for malformations: Secondary | ICD-10-CM

## 2020-08-01 DIAGNOSIS — Z3A24 24 weeks gestation of pregnancy: Secondary | ICD-10-CM

## 2020-08-01 NOTE — Progress Notes (Signed)
[place] 24+3 wks,cephalic,posterior placenta grade 0,normal ovaries,cervical length 3.3 cm,single vertical pocket of fluid 5.6 cm,normal ovaries,right renal pelvic dilation 5.5 mm,left renal pelvis WNL,FHR 157 bpm,EFW 660 g 27%,anatomy complete

## 2020-08-08 ENCOUNTER — Encounter: Payer: Self-pay | Admitting: Advanced Practice Midwife

## 2020-08-08 DIAGNOSIS — Z3490 Encounter for supervision of normal pregnancy, unspecified, unspecified trimester: Secondary | ICD-10-CM | POA: Insufficient documentation

## 2020-08-09 ENCOUNTER — Ambulatory Visit: Payer: Self-pay | Admitting: *Deleted

## 2020-08-09 ENCOUNTER — Encounter: Payer: Self-pay | Admitting: Advanced Practice Midwife

## 2020-08-09 DIAGNOSIS — Z3480 Encounter for supervision of other normal pregnancy, unspecified trimester: Secondary | ICD-10-CM

## 2020-09-18 ENCOUNTER — Telehealth: Payer: Self-pay | Admitting: *Deleted

## 2020-09-18 NOTE — Telephone Encounter (Signed)
Patient states she noticed in the shower a "knot" in her pelvic area near her labia.  Not painful and does not seem to be getting larger.  Does have HSV but states "that is not what it is". I informed patient it could be a cyst but without seeing her, it would be hard to diagnose and treat over the phone.  Offered for patient to be seen tomorrow but patient states she cannot come until March.  Patient states she does not have the money to be seen.  Advised if area became worse, to have it looked at.  Pt verbalized understanding.

## 2020-10-09 ENCOUNTER — Encounter: Payer: Self-pay | Admitting: Obstetrics & Gynecology

## 2020-10-09 ENCOUNTER — Ambulatory Visit: Payer: Self-pay | Admitting: Obstetrics & Gynecology

## 2020-10-09 ENCOUNTER — Other Ambulatory Visit: Payer: Self-pay

## 2020-10-09 VITALS — BP 127/81 | HR 73 | Wt 169.4 lb

## 2020-10-09 DIAGNOSIS — Z3483 Encounter for supervision of other normal pregnancy, third trimester: Secondary | ICD-10-CM

## 2020-10-09 DIAGNOSIS — O0930 Supervision of pregnancy with insufficient antenatal care, unspecified trimester: Secondary | ICD-10-CM

## 2020-10-09 DIAGNOSIS — B009 Herpesviral infection, unspecified: Secondary | ICD-10-CM

## 2020-10-09 MED ORDER — ACYCLOVIR 400 MG PO TABS: 400.0000 mg | ORAL_TABLET | Freq: Three times a day (TID) | ORAL | Status: AC

## 2020-10-09 NOTE — Progress Notes (Signed)
LOW-RISK PREGNANCY VISIT Patient name: Stacy Wall MRN [id number]  Date of birth: 09-05-1990 Chief Complaint:   Routine Prenatal Visit  History of Present Illness:   Stacy Wall is a 30 y.o. 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 Contractions: Not present. Vag. Bleeding: None.  Movement: Present. denies leaking of fluid.  Notes the "knot" has been present for a few weeks- most noticeable after a warm shower standing up.  Denies fever or chills.  Denies that the lump is getting bigger.  Review of Systems:   Pertinent items are noted in HPI Denies abnormal vaginal discharge w/ itching/odor/irritation, headaches, visual changes, shortness of breath, chest pain, abdominal pain, severe nausea/vomiting, or problems with urination or bowel movements unless otherwise stated above. Pertinent History Reviewed:  Reviewed past medical,surgical, social, obstetrical and family history.  Reviewed problem list, medications and allergies.  Physical Assessment:   Vitals:   10/09/20 0916  BP: 127/81  Pulse: 73  Weight: 169 lb 6.4 oz (76.8 kg)  Body mass index is 28.19 kg/m.        Physical Examination:   General appearance: Well appearing, and in no distress  Mental status: Alert, oriented to person, place, and time  Skin: Warm & dry  Respiratory: Normal  respiratory effort, no distress  Abdomen: Soft, gravid, nontender  GU: Left groin- no edema or erythema, no discrete mass appreciated Pelvic: Cervical exam deferred         Extremities: Edema: Trace  Psych:  mood and affect appropriate  Fetal Status: Fetal Heart Rate (bpm): 140 Fundal Height: 27 cm Movement: Present    Chaperone: n/a    Assessment & Plan:  1) Low-risk pregnancy G3P2002 at [redacted]w[redacted]d with an Estimated Date of Delivery: 11/18/20   2) HSV2- suppression therapy sent in- encouraged pt to start taking @ 36wks Reviewed precautions  3) Small for dates- plan for growth scan next visit  Encouraged pt to reconsider lab work today including importance of DM to make sure that closer follow up is not indicated.  Pt declined.  She reports that she is ok with doing all her lab work when she goes to the hospital because she states at least she knows medicaid will cover the hospital visit.   Meds:  Meds ordered this encounter  Medications  . acyclovir (ZOVIRAX) tablet 400 mg   Labs/procedures today: pt declined all lab work today due to concern for financial burden   Plan:  Continue routine obstetrical care and encouraged regular visits  Next visit: prefers in person    Reviewed: Preterm labor symptoms and general obstetric precautions including but not limited to vaginal bleeding, contractions, leaking of fluid and fetal movement were reviewed in detail with the patient.  All questions were answered.  Follow-up: Return in about 3 weeks (around 10/30/2020) for LROB visit and growth scan (small  for dates).  Stacy Hidalgo, DO Attending Obstetrician & Gynecologist, [facility] for [employer], [facility]

## 2020-11-03 ENCOUNTER — Other Ambulatory Visit: Payer: Self-pay | Admitting: Obstetrics & Gynecology

## 2020-11-03 DIAGNOSIS — O26843 Uterine size-date discrepancy, third trimester: Secondary | ICD-10-CM

## 2020-11-06 ENCOUNTER — Other Ambulatory Visit: Payer: Self-pay

## 2020-11-06 ENCOUNTER — Ambulatory Visit: Payer: Self-pay

## 2020-11-06 ENCOUNTER — Telehealth: Payer: Self-pay | Admitting: Women's Health

## 2020-11-06 ENCOUNTER — Encounter: Payer: Self-pay | Admitting: Women's Health

## 2020-11-06 NOTE — Telephone Encounter (Signed)
Patient called, upset about today's appointment  Pt states she arrived late for U/S & waited to see provider   States she was told that seeing the provider was to discuss what U/S showed & since she couldn't get U/S due to being late & after waiting for what pt states was an hour - pt left without being seen & now has called back to ask what to do since she's due 4/23  I explained that we might possibly send her to MFM for U/S - please advise & call pt

## 2020-11-07 NOTE — Progress Notes (Signed)
Pt not seen by provider. Was late for u/s appt and walked out w/o being seen for appt. This encounter was created in error - please disregard.

## 2020-11-16 ENCOUNTER — Telehealth: Payer: Self-pay | Admitting: Obstetrics & Gynecology

## 2020-11-16 NOTE — Telephone Encounter (Signed)
LMOVM returning patient's call.  

## 2020-11-16 NOTE — Telephone Encounter (Signed)
Patient wants a returned phone to discuss what to expect when she goes into the hospital to deliver. Clinical staff will follow up with patient.

## 2020-11-17 ENCOUNTER — Telehealth: Payer: Self-pay

## 2020-11-17 NOTE — Telephone Encounter (Signed)
Pt returning call from a VM that was left on 11/16/2020.

## 2020-11-17 NOTE — Telephone Encounter (Signed)
Patient states she is due tomorrow and wants to be inducted.  Informed patient that she will need to be seen in order for induction to be set up.  Appt made for Monday.

## 2020-11-17 NOTE — Telephone Encounter (Signed)
Patient wants a returned phone call from [facility] today, per patient.

## 2020-11-20 ENCOUNTER — Encounter: Payer: Self-pay | Admitting: Women's Health

## 2020-11-20 ENCOUNTER — Other Ambulatory Visit: Payer: Self-pay

## 2020-11-20 ENCOUNTER — Ambulatory Visit (INDEPENDENT_AMBULATORY_CARE_PROVIDER_SITE_OTHER): Payer: Self-pay | Admitting: Women's Health

## 2020-11-20 ENCOUNTER — Telehealth (HOSPITAL_COMMUNITY): Payer: Self-pay | Admitting: *Deleted

## 2020-11-20 ENCOUNTER — Encounter (HOSPITAL_COMMUNITY): Payer: Self-pay | Admitting: *Deleted

## 2020-11-20 VITALS — BP 124/85 | HR 70 | Wt 180.0 lb

## 2020-11-20 DIAGNOSIS — O26849 Uterine size-date discrepancy, unspecified trimester: Secondary | ICD-10-CM | POA: Insufficient documentation

## 2020-11-20 DIAGNOSIS — Z3A40 40 weeks gestation of pregnancy: Secondary | ICD-10-CM

## 2020-11-20 DIAGNOSIS — O480 Post-term pregnancy: Secondary | ICD-10-CM

## 2020-11-20 DIAGNOSIS — O26843 Uterine size-date discrepancy, third trimester: Secondary | ICD-10-CM

## 2020-11-20 DIAGNOSIS — B009 Herpesviral infection, unspecified: Secondary | ICD-10-CM

## 2020-11-20 DIAGNOSIS — Z3483 Encounter for supervision of other normal pregnancy, third trimester: Secondary | ICD-10-CM

## 2020-11-20 DIAGNOSIS — Z3480 Encounter for supervision of other normal pregnancy, unspecified trimester: Secondary | ICD-10-CM

## 2020-11-20 MED ORDER — ACYCLOVIR 400 MG PO TABS: 400.0000 mg | ORAL_TABLET | Freq: Three times a day (TID) | ORAL | 3 refills | Status: DC

## 2020-11-20 NOTE — Progress Notes (Addendum)
LOW-RISK PREGNANCY VISIT Patient name: Stacy Wall MRN [id number]  Date of birth: 07/04/1991 Chief Complaint:   Routine Prenatal Visit  History of Present Illness:   Stacy Wall is a 30 y.o. G23P2002 female at [redacted]w[redacted]d with an Estimated Date of Delivery: 11/18/20 being seen today for ongoing management of a low-risk pregnancy.  Depression screen PHQ 2/9 04/28/2018  Decreased Interest 0  Down, Depressed, Hopeless 0  PHQ - 2 Score 0  Altered sleeping 0  Tired, decreased energy 0  Change in appetite 0  Feeling bad or failure about yourself  0  Trouble concentrating 0  Moving slowly or fidgety/restless 0  Suicidal thoughts 0  PHQ-9 Score 0  Difficult doing work/chores Not difficult at all    Today she reports pressure. HSV outbreak ~4/10, had expired meds from previous pregnancy she used. Feels like everything has completely resolved, no current symptoms. Contractions: Not present.  .  Movement: Present. denies leaking of fluid. Review of Systems:   Pertinent items are noted in HPI Denies abnormal vaginal discharge w/ itching/odor/irritation, headaches, visual changes, shortness of breath, chest pain, abdominal pain, severe nausea/vomiting, or problems with urination or bowel movements unless otherwise stated above. Pertinent History Reviewed:  Reviewed past medical,surgical, social, obstetrical and family history.  Reviewed problem list, medications and allergies. Physical Assessment:   Vitals:   11/20/20 0946  BP: 124/85  Pulse: 70  Weight: 180 lb (81.6 kg)  Body mass index is 29.95 kg/m.        Physical Examination:   General appearance: Well appearing, and in no distress  Mental status: Alert, oriented to person, place, and time  Skin: Warm & dry  Cardiovascular: Normal heart rate noted  Respiratory: Normal respiratory effort, no distress  Abdomen: Soft, gravid, nontender  Pelvic: Cervical exam performed  Dilation: 3 Effacement (%): 50 Station: -2, HSV lesion  completely resolved, no active lesions noted  Extremities: Edema: Trace  Fetal Status: Fetal Heart Rate (bpm): 130 Fundal Height: 30 cm Movement: Present Presentation: Vertex   NST: FHR baseline 130 bpm, Variability: moderate, Accelerations:present, Decelerations:  Absent= Cat 1/reactive Toco: irregular   Chaperone: Malachy Mood    Discussed HSV outbreak ~2wks ago w/ LHE, OK w/ vag birth- so went back in and offered membrane sweep, pt wanted to do, so membranes swept Chaperone: Faith Rogue  No results found for this or any previous visit (from the past 24 hour(s)).  Assessment & Plan:  1) Low-risk pregnancy G3P2002 at [redacted]w[redacted]d with an Estimated Date of Delivery: 11/18/20   2) Late & limited prenatal care, only 2 visits (34 & 40wks), self-pay- declines all labs. Did agree to GBS today  3) HSV> outbreak ~2wks ago (around 4/10), currently asymptomatic w/ normal exam. Discussed w/ LHE, OK for vaginal birth. Discussed w/ pt, they will assess again on admit, and will ultimately be up to attending MD on at that time. Rx acyclovir to start ASAP  4) Size <dates- no u/s available until well after IOL date, will add to cancellation call list. Reactive NST today. Pt was size <dates @ 34wk visit as well, u/s was scheduled for 4/11 and pt was late- so was unable to do. Did not stay for visit that day. Dating by LMP c/w 24wk u/s, EFW was 27% at that time.  5) Mild Rt fetal RPD> was late for f/u u/s on 4/11, so not done. Notify peds on admit.   6) Postdates> reactive NST today, membranes swept. IOL scheduled for this Sat 4/30  AM.  IOL form faxed via Epic and orders placed    Meds:  Meds ordered this encounter  Medications  . acyclovir (ZOVIRAX) 400 MG tablet    Sig: Take 1 tablet (400 mg total) by mouth 3 (three) times daily.    Dispense:  90 tablet    Refill:  3    Order Specific Question:   Supervising Provider    Answer:   Lazaro Arms [[id number]]   Labs/procedures today: GBS, SVE and NST  Plan:   IOL 4/30  Reviewed: Term labor symptoms and general obstetric precautions including but not limited to vaginal bleeding, contractions, leaking of fluid and fetal movement were reviewed in detail with the patient.  All questions were answered.  Follow-up: Return for ASAP, US:EFW (if none available, add to cancellation call list).  Future Appointments  Date Time Provider Department Center  11/25/2020  8:35 AM [facility] SCHED ROOM [facility] None    Orders Placed This Encounter  Procedures  . Strep Gp B NAA   Cheral Marker CNM, [facility] 11/20/2020 1:09 PM

## 2020-11-20 NOTE — Patient Instructions (Addendum)
Magdalene River, I greatly value your feedback.  If you receive a survey following your visit with [place] today, we appreciate you taking the time to fill it out.  Thanks, Joellyn Haff, CNM, [facility]  [facility]'s [facility] Children[facility] at [facility] ([address] [place], [place] [address]) Entrance C, located off of [address] Free 24/7 valet parking   Your induction is scheduled for Saturday 4/30. Please DO NOT show up at the time you see in MyChart. Someone from Labor & Delivery will call you on the date of your induction to let you know what time to come in. Please keep your phone on and with you at all times, you have 1 hour to respond to them to let them know you are on your way.  Go to the main desk at the [facility] and let them know you are there to be induced. They will send someone from Labor & Delivery to come get you.  You will get a call from a nurse from the hospital within the next day or so to go over some information, she will also schedule you to go to the hospital a few days before you induction to have your Covid test. If you have any questions, please let us know.    Go to [email or web address] to register for FREE online childbirth classes    Call the office [phone]) or go to [facility] if:  You begin to have strong, frequent contractions  Your water breaks.  Sometimes it is a big gush of fluid, sometimes it is just a trickle that keeps getting your panties wet or running down your legs  You have vaginal bleeding.  It is normal to have a small amount of spotting if your cervix was checked.   You don't feel your baby moving like normal.  If you don't, get you something to eat and drink and lay down and focus on feeling your baby move.  You should feel at least 10 movements in 2 hours.  If you don't, you should call the office or go to [facility]   Call the office [phone]) or go to [facility] hospital for these signs of pre-eclampsia:  Severe headache  that does not go away with Tylenol  Visual changes- seeing spots, double, blurred vision  Pain under your right breast or upper abdomen that does not go away with Tums or heartburn medicine  Nausea and/or vomiting  Severe swelling in your hands, feet, and face    Home Blood Pressure Monitoring for Patients   Your provider has recommended that you check your blood pressure (BP) at least once a week at home. If you do not have a blood pressure cuff at home, one will be provided for you. Contact your provider if you have not received your monitor within 1 week.   Helpful Tips for Accurate Home Blood Pressure Checks  . Don't smoke, exercise, or drink caffeine 30 minutes before checking your BP . Use the restroom before checking your BP (a full bladder can raise your pressure) . Relax in a comfortable upright chair . Feet on the ground . Left arm resting comfortably on a flat surface at the level of your heart . Legs uncrossed . Back supported . Sit quietly and don't talk . Place the cuff on your bare arm . Adjust snuggly, so that only two fingertips can fit between your skin and the top of the cuff . Check 2 readings separated by at  least one minute . Keep a log of your BP readings . For a visual, please reference this diagram: http://ccnc.care/bpdiagram  Provider Name: [facility]     Phone: [phone]  Zone 1: ALL CLEAR  Continue to monitor your symptoms:  . BP reading is less than 140 (top number) or less than 90 (bottom number)  . No right upper stomach pain . No headaches or seeing spots . No feeling nauseated or throwing up . No swelling in face and hands  Zone 2: CAUTION Call your doctor's office for any of the following:  . BP reading is greater than 140 (top number) or greater than 90 (bottom number)  . Stomach pain under your ribs in the middle or right side . Headaches or seeing spots . Feeling nauseated or throwing up . Swelling in face and hands  Zone  3: EMERGENCY  Seek immediate medical care if you have any of the following:  . BP reading is greater than160 (top number) or greater than 110 (bottom number) . Severe headaches not improving with Tylenol . Serious difficulty catching your breath . Any worsening symptoms from Zone 2   Braxton Hicks Contractions Contractions of the uterus can occur throughout pregnancy, but they are not always a sign that you are in labor. You may have practice contractions called Braxton Hicks contractions. These false labor contractions are sometimes confused with true labor. What are Deberah Pelton contractions? Braxton Hicks contractions are tightening movements that occur in the muscles of the uterus before labor. Unlike true labor contractions, these contractions do not result in opening (dilation) and thinning of the cervix. Toward the end of pregnancy (32-34 weeks), Braxton Hicks contractions can happen more often and may become stronger. These contractions are sometimes difficult to tell apart from true labor because they can be very uncomfortable. You should not feel embarrassed if you go to the hospital with false labor. Sometimes, the only way to tell if you are in true labor is for your health care provider to look for changes in the cervix. The health care provider will do a physical exam and may monitor your contractions. If you are not in true labor, the exam should show that your cervix is not dilating and your water has not broken. If there are no other health problems associated with your pregnancy, it is completely safe for you to be sent home with false labor. You may continue to have Braxton Hicks contractions until you go into true labor. How to tell the difference between true labor and false labor True labor  Contractions last 30-70 seconds.  Contractions become very regular.  Discomfort is usually felt in the top of the uterus, and it spreads to the lower abdomen and low  back.  Contractions do not go away with walking.  Contractions usually become more intense and increase in frequency.  The cervix dilates and gets thinner. False labor  Contractions are usually shorter and not as strong as true labor contractions.  Contractions are usually irregular.  Contractions are often felt in the front of the lower abdomen and in the groin.  Contractions may go away when you walk around or change positions while lying down.  Contractions get weaker and are shorter-lasting as time goes on.  The cervix usually does not dilate or become thin. Follow these instructions at home:  1. Take over-the-counter and prescription medicines only as told by your health care provider. 2. Keep up with your usual exercises and follow other  instructions from your health care provider. 3. Eat and drink lightly if you think you are going into labor. 4. If Braxton Hicks contractions are making you uncomfortable: ? Change your position from lying down or resting to walking, or change from walking to resting. ? Sit and rest in a tub of warm water. ? Drink enough fluid to keep your urine pale yellow. Dehydration may cause these contractions. ? Do slow and deep breathing several times an hour. 5. Keep all follow-up prenatal visits as told by your health care provider. This is important. Contact a health care provider if:  You have a fever.  You have continuous pain in your abdomen. Get help right away if:  Your contractions become stronger, more regular, and closer together.  You have fluid leaking or gushing from your vagina.  You pass blood-tinged mucus (bloody show).  You have bleeding from your vagina.  You have low back pain that you never had before.  You feel your baby's head pushing down and causing pelvic pressure.  Your baby is not moving inside you as much as it used to. Summary  Contractions that occur before labor are called Braxton Hicks contractions,  false labor, or practice contractions.  Braxton Hicks contractions are usually shorter, weaker, farther apart, and less regular than true labor contractions. True labor contractions usually become progressively stronger and regular, and they become more frequent.  Manage discomfort from [facility] contractions by changing position, resting in a warm bath, drinking plenty of water, or practicing deep breathing. This information is not intended to replace advice given to you by your health care provider. Make sure you discuss any questions you have with your health care provider. Document Revised: 06/27/2017 Document Reviewed: 11/28/2016 Elsevier Patient Education  2020 [employer].

## 2020-11-20 NOTE — Telephone Encounter (Signed)
Preadmission screen  

## 2020-11-21 ENCOUNTER — Other Ambulatory Visit: Payer: Self-pay | Admitting: Advanced Practice Midwife

## 2020-11-21 ENCOUNTER — Other Ambulatory Visit: Payer: Self-pay

## 2020-11-21 ENCOUNTER — Inpatient Hospital Stay (HOSPITAL_COMMUNITY)
Admission: AD | Admit: 2020-11-21 | Discharge: 2020-11-23 | DRG: 806 | Disposition: A | Discharge status: Home / Self Care | Payer: Medicaid Other | Attending: Family Medicine | Admitting: Family Medicine

## 2020-11-21 ENCOUNTER — Encounter (HOSPITAL_COMMUNITY): Payer: Self-pay | Admitting: Obstetrics and Gynecology

## 2020-11-21 DIAGNOSIS — A6000 Herpesviral infection of urogenital system, unspecified: Secondary | ICD-10-CM | POA: Diagnosis present

## 2020-11-21 DIAGNOSIS — Z3A40 40 weeks gestation of pregnancy: Secondary | ICD-10-CM

## 2020-11-21 DIAGNOSIS — O9852 Other viral diseases complicating childbirth: Secondary | ICD-10-CM | POA: Diagnosis not present

## 2020-11-21 DIAGNOSIS — B009 Herpesviral infection, unspecified: Secondary | ICD-10-CM | POA: Diagnosis not present

## 2020-11-21 DIAGNOSIS — Z3480 Encounter for supervision of other normal pregnancy, unspecified trimester: Secondary | ICD-10-CM

## 2020-11-21 DIAGNOSIS — Z20822 Contact with and (suspected) exposure to covid-19: Secondary | ICD-10-CM | POA: Diagnosis present

## 2020-11-21 DIAGNOSIS — O26893 Other specified pregnancy related conditions, third trimester: Secondary | ICD-10-CM | POA: Diagnosis present

## 2020-11-21 DIAGNOSIS — O9832 Other infections with a predominantly sexual mode of transmission complicating childbirth: Secondary | ICD-10-CM | POA: Diagnosis present

## 2020-11-21 DIAGNOSIS — O9853 Other viral diseases complicating the puerperium: Secondary | ICD-10-CM | POA: Diagnosis not present

## 2020-11-21 DIAGNOSIS — O480 Post-term pregnancy: Secondary | ICD-10-CM | POA: Diagnosis present

## 2020-11-21 HISTORY — DX: Other specified health status: Z78.9

## 2020-11-21 LAB — RESP PANEL BY RT-PCR (FLU A&B, COVID) ARPGX2
Influenza A by PCR: NEGATIVE
Influenza B by PCR: NEGATIVE
SARS Coronavirus 2 by RT PCR: NEGATIVE

## 2020-11-21 LAB — CBC
HCT: 36.8 % (ref 36.0–46.0)
Hemoglobin: 12.6 g/dL (ref 12.0–15.0)
MCH: 31.1 pg (ref 26.0–34.0)
MCHC: 34.2 g/dL (ref 30.0–36.0)
MCV: 90.9 fL (ref 80.0–100.0)
Platelets: 204 10*3/uL (ref 150–400)
RBC: 4.05 MIL/uL (ref 3.87–5.11)
RDW: 13.2 % (ref 11.5–15.5)
WBC: 14.7 10*3/uL — ABNORMAL HIGH (ref 4.0–10.5)
nRBC: 0 % (ref 0.0–0.2)

## 2020-11-21 LAB — HEPATITIS B SURFACE ANTIGEN: Hepatitis B Surface Ag: NONREACTIVE

## 2020-11-21 LAB — TYPE AND SCREEN
ABO/RH(D): O POS
Antibody Screen: NEGATIVE

## 2020-11-21 LAB — HIV ANTIBODY (ROUTINE TESTING W REFLEX): HIV Screen 4th Generation wRfx: NONREACTIVE

## 2020-11-21 LAB — HEPATITIS C ANTIBODY: HCV Ab: NONREACTIVE

## 2020-11-21 MED ORDER — ACETAMINOPHEN 325 MG PO TABS
650.0000 mg | ORAL_TABLET | Freq: Four times a day (QID) | ORAL | Status: DC
  Administered 2020-11-21 – 2020-11-23 (×9): 650 mg via ORAL
  Filled 2020-11-21 (×10): qty 2

## 2020-11-21 MED ORDER — ACETAMINOPHEN 325 MG PO TABS: 650.0000 mg | ORAL_TABLET | ORAL | Status: DC | PRN

## 2020-11-21 MED ORDER — SENNOSIDES-DOCUSATE SODIUM 8.6-50 MG PO TABS
2.0000 | ORAL_TABLET | Freq: Every day | ORAL | Status: DC
  Administered 2020-11-22 – 2020-11-23 (×2): 2 via ORAL
  Filled 2020-11-21 (×3): qty 2

## 2020-11-21 MED ORDER — SIMETHICONE 80 MG PO CHEW: 80.0000 mg | CHEWABLE_TABLET | ORAL | Status: DC | PRN

## 2020-11-21 MED ORDER — TERBUTALINE SULFATE 1 MG/ML IJ SOLN: 0.2500 mg | Freq: Once | INTRAMUSCULAR | Status: DC | PRN

## 2020-11-21 MED ORDER — WITCH HAZEL-GLYCERIN EX PADS: 1.0000 "application " | MEDICATED_PAD | CUTANEOUS | Status: DC | PRN

## 2020-11-21 MED ORDER — DIBUCAINE (PERIANAL) 1 % EX OINT: 1.0000 "application " | TOPICAL_OINTMENT | CUTANEOUS | Status: DC | PRN

## 2020-11-21 MED ORDER — FENTANYL-BUPIVACAINE-NACL 0.5-0.125-0.9 MG/250ML-% EP SOLN
12.0000 mL/h | EPIDURAL | Status: DC | PRN
Start: 2020-11-21 — End: 2020-11-21
  Filled 2020-11-21: qty 250

## 2020-11-21 MED ORDER — LACTATED RINGERS IV SOLN: INTRAVENOUS | Status: DC

## 2020-11-21 MED ORDER — DIPHENHYDRAMINE HCL 25 MG PO CAPS: 25.0000 mg | ORAL_CAPSULE | Freq: Four times a day (QID) | ORAL | Status: DC | PRN

## 2020-11-21 MED ORDER — IBUPROFEN 600 MG PO TABS
600.0000 mg | ORAL_TABLET | Freq: Four times a day (QID) | ORAL | Status: DC
  Administered 2020-11-21 – 2020-11-23 (×10): 600 mg via ORAL
  Filled 2020-11-21 (×10): qty 1

## 2020-11-21 MED ORDER — ONDANSETRON HCL 4 MG/2ML IJ SOLN: 4.0000 mg | INTRAMUSCULAR | Status: DC | PRN

## 2020-11-21 MED ORDER — EPHEDRINE 5 MG/ML INJ: 10.0000 mg | INTRAVENOUS | Status: DC | PRN

## 2020-11-21 MED ORDER — BENZOCAINE-MENTHOL 20-0.5 % EX AERO
1.0000 "application " | INHALATION_SPRAY | CUTANEOUS | Status: DC | PRN
  Administered 2020-11-21: 1 via TOPICAL
  Filled 2020-11-21: qty 56

## 2020-11-21 MED ORDER — OXYTOCIN-SODIUM CHLORIDE 30-0.9 UT/500ML-% IV SOLN
2.5000 [IU]/h | INTRAVENOUS | Status: DC
  Administered 2020-11-21: 2.5 [IU]/h via INTRAVENOUS
  Filled 2020-11-21: qty 500

## 2020-11-21 MED ORDER — HYDROXYZINE HCL 50 MG PO TABS: 50.0000 mg | ORAL_TABLET | Freq: Four times a day (QID) | ORAL | Status: DC | PRN

## 2020-11-21 MED ORDER — FLEET ENEMA 7-19 GM/118ML RE ENEM: 1.0000 | ENEMA | RECTAL | Status: DC | PRN

## 2020-11-21 MED ORDER — OXYTOCIN-SODIUM CHLORIDE 30-0.9 UT/500ML-% IV SOLN: 1.0000 m[IU]/min | INTRAVENOUS | Status: DC

## 2020-11-21 MED ORDER — LACTATED RINGERS IV SOLN
500.0000 mL | Freq: Once | INTRAVENOUS | Status: AC
  Administered 2020-11-21: 500 mL via INTRAVENOUS

## 2020-11-21 MED ORDER — ONDANSETRON HCL 4 MG/2ML IJ SOLN: 4.0000 mg | Freq: Four times a day (QID) | INTRAMUSCULAR | Status: DC | PRN

## 2020-11-21 MED ORDER — ONDANSETRON HCL 4 MG PO TABS
4.0000 mg | ORAL_TABLET | ORAL | Status: DC | PRN
Start: 2020-11-21 — End: 2020-11-24

## 2020-11-21 MED ORDER — FENTANYL CITRATE (PF) 100 MCG/2ML IJ SOLN
100.0000 ug | INTRAMUSCULAR | Status: DC | PRN
  Administered 2020-11-21: 100 ug via INTRAVENOUS
  Filled 2020-11-21: qty 2

## 2020-11-21 MED ORDER — DIPHENHYDRAMINE HCL 50 MG/ML IJ SOLN
12.5000 mg | INTRAMUSCULAR | Status: DC | PRN
Start: 2020-11-21 — End: 2020-11-21

## 2020-11-21 MED ORDER — SOD CITRATE-CITRIC ACID 500-334 MG/5ML PO SOLN: 30.0000 mL | ORAL | Status: DC | PRN

## 2020-11-21 MED ORDER — COCONUT OIL OIL
1.0000 "application " | TOPICAL_OIL | Status: DC | PRN
  Administered 2020-11-22: 1 via TOPICAL

## 2020-11-21 MED ORDER — LIDOCAINE HCL (PF) 1 % IJ SOLN: 30.0000 mL | INTRAMUSCULAR | Status: DC | PRN

## 2020-11-21 MED ORDER — LACTATED RINGERS IV SOLN: 500.0000 mL | INTRAVENOUS | Status: DC | PRN

## 2020-11-21 MED ORDER — PHENYLEPHRINE 40 MCG/ML (10ML) SYRINGE FOR IV PUSH (FOR BLOOD PRESSURE SUPPORT)
80.0000 ug | PREFILLED_SYRINGE | INTRAVENOUS | Status: DC | PRN
  Filled 2020-11-21: qty 10

## 2020-11-21 MED ORDER — PHENYLEPHRINE 40 MCG/ML (10ML) SYRINGE FOR IV PUSH (FOR BLOOD PRESSURE SUPPORT): 80.0000 ug | PREFILLED_SYRINGE | INTRAVENOUS | Status: DC | PRN

## 2020-11-21 MED ORDER — PRENATAL MULTIVITAMIN CH
1.0000 | ORAL_TABLET | Freq: Every day | ORAL | Status: DC
  Administered 2020-11-23: 1 via ORAL
  Filled 2020-11-21 (×2): qty 1

## 2020-11-21 MED ORDER — TETANUS-DIPHTH-ACELL PERTUSSIS 5-2.5-18.5 LF-MCG/0.5 IM SUSY: 0.5000 mL | PREFILLED_SYRINGE | Freq: Once | INTRAMUSCULAR | Status: DC

## 2020-11-21 MED ORDER — OXYTOCIN BOLUS FROM INFUSION
333.0000 mL | Freq: Once | INTRAVENOUS | Status: AC
  Administered 2020-11-21: 333 mL via INTRAVENOUS

## 2020-11-21 NOTE — Discharge Summary (Addendum)
Postpartum Discharge Summary    Patient Name: Stacy Wall DOB: Mar 03, 1991 MRN: [id number]  Date of admission: 11/21/2020 Delivery date:11/21/2020  Delivering provider: Randa Ngo  Date of discharge: 11/22/2020  Admitting diagnosis: Post-dates pregnancy [O48.0] Intrauterine pregnancy: [redacted]w[redacted]d    Secondary diagnosis:  Principal Problem:   Vaginal delivery Active Problems:   HSV-2 (herpes simplex virus 2) infection   Post-dates pregnancy  Additional problems: as noted above  Discharge diagnosis: Term Pregnancy Delivered                                              Post partum procedures:N/A Augmentation: none Complications: None  Hospital course: Onset of Labor With Vaginal Delivery      30y.o. yo [id number] admitted in Active Labor on 11/21/2020. Of note, given recurrent genital HSV outbreak on 11/05/20, speculum exam was performed on admission without evidence of lesions. Pt reported good adherence to acyclovir and no report of concerning symptoms. Patient had an uncomplicated labor course as follows:  Membrane Rupture Time/Date: 11:15 AM ,11/21/2020   Delivery Method:Vaginal, Spontaneous  Episiotomy: None  Lacerations:  None  Patient had an uncomplicated postpartum course. She is ambulating, tolerating a regular diet, passing flatus, and urinating well. Patient is discharged home in stable condition on 11/22/20.  Newborn Data: Birth date:11/21/2020  Birth time:11:27 AM  Gender:Female  Living status:Living  Apgars:8 ,9  Weight:3118 g   Magnesium Sulfate received: No BMZ received: No Rhophylac:N/A MMR:No T-DaP:Declinedoffered prior to discharge Flu: N/A Transfusion:No  Physical exam  Vitals:   11/21/20 1425 11/21/20 1826 11/21/20 2210 11/22/20 0253  BP: 126/75 123/87 118/78 117/68  Pulse: 70 77 (!) 58 62  Resp: 16 16 16    Temp: 98.1 F (36.7 C) 98.3 F (36.8 C) 98 F (36.7 C) 98.2 F (36.8 C)  TempSrc: Oral Oral Oral Oral  SpO2: 100% 99% 100%  100%  Weight:      Height:       General: alert, cooperative and no distress Lochia: appropriate Uterine Fundus: firm Incision: N/A DVT Evaluation: No evidence of DVT seen on physical exam. Labs: Lab Results  Component Value Date   WBC 14.7 (H) 11/21/2020   HGB 12.6 11/21/2020   HCT 36.8 11/21/2020   MCV 90.9 11/21/2020   PLT 204 11/21/2020   CMP Latest Ref Rng & Units 09/07/2018  Glucose 70 - 99 mg/dL 98  BUN 6 - 20 mg/dL 13  Creatinine 0.44 - 1.00 mg/dL 0.63  Sodium 135 - 145 mmol/L 138  Potassium 3.5 - 5.1 mmol/L 3.5  Chloride 98 - 111 mmol/L 103  CO2 22 - 32 mmol/L 25  Calcium 8.9 - 10.3 mg/dL 8.9  Total Protein 6.5 - 8.1 g/dL 7.3  Total Bilirubin 0.3 - 1.2 mg/dL <0.1(L)  Alkaline Phos 38 - 126 U/L 110  AST 15 - 41 U/L 29  ALT 0 - 44 U/L 36   Edinburgh Score: Edinburgh Postnatal Depression Scale Screening Tool 11/21/2020  I have been able to laugh and see the funny side of things. 0  I have looked forward with enjoyment to things. 0  I have blamed myself unnecessarily when things went wrong. 0  I have been anxious or worried for no good reason. 0  I have felt scared or panicky for no good reason. 0  Things have been getting on  top of me. 0  I have been so unhappy that I have had difficulty sleeping. 0  I have felt sad or miserable. 0  I have been so unhappy that I have been crying. 0  The thought of harming myself has occurred to me. 0  Edinburgh Postnatal Depression Scale Total 0     After visit meds:  Allergies as of 11/22/2020      Reactions   Hydrocodone Rash      Medication List    TAKE these medications   acyclovir 400 MG tablet Commonly known as: ZOVIRAX Take 1 tablet (400 mg total) by mouth 3 (three) times daily.   coconut oil Oil Apply 1 application topically as needed.   ibuprofen 600 MG tablet Commonly known as: ADVIL Take 1 tablet (600 mg total) by mouth every 6 (six) hours.   PRENATAL VITAMIN PO Take by mouth.   simethicone 80 MG  chewable tablet Commonly known as: MYLICON Chew 1 tablet (80 mg total) by mouth as needed for flatulence.        Discharge home in stable condition Infant Feeding: Breast Infant Disposition:home with mother Discharge instruction: per After Visit Summary and Postpartum booklet. Activity: Advance as tolerated. Pelvic rest for 6 weeks.  Diet: routine diet Future Appointments:No future appointments. Follow up Visit: Message sent to FT on 4/26 by Dr. Astrid Drafts.  Please schedule this patient for a In person postpartum visit in 6 weeks with the following provider: Any provider. Additional Postpartum F/U:none  Low risk pregnancy complicated by: recurrent HSV outbreak on 11/05/20 Delivery mode:  Vaginal, Spontaneous  Anticipated Birth Control:  POPs   11/22/2020 Delora Fuel, MD   I saw and evaluated the patient. I agree with the findings and the plan of care as documented in the resident's note.  Sharene Skeans, MD [facility] Family Medicine Fellow, [facility] for [facility], [place]

## 2020-11-21 NOTE — H&P (Addendum)
OBSTETRIC ADMISSION HISTORY AND PHYSICAL  Stacy Wall is a 30 y.o. female [phone] with IUP at [redacted]w[redacted]d by L/24 presenting for spontaneous onset of labor. She reports +FMs, No LOF, no VB, no blurry vision, headaches or peripheral edema, and RUQ pain. She plans on breast feeding. She request POPs for birth control. Pt reports good adherence to acyclovir on admission. No report of vaginal pain, numbness or tingling.  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 Chronic back pain   . Gallstones   . Herpes   . Medical history non-contributory     Past Surgical History: Past Surgical History:  Procedure Laterality Date  . NO PAST SURGERIES      Obstetrical History: OB History    Gravida  4   Para  3   Term  3   Preterm      AB      Living  3     SAB      IAB      Ectopic      Multiple      Live Births  3           Social History Social History   Socioeconomic History  . Marital status: Single    Spouse name: 5/25  . Number of children: 2  . Years of education: Not on file  . Highest education level: 10th grade  Occupational History  . Not on file  Tobacco Use  . Smoking status: Never Smoker  . Smokeless tobacco: Never Used  Vaping Use  . Vaping Use: Never used  Substance and Sexual Activity  . Alcohol use: No  . Drug use: No  . Sexual activity: Yes    Birth control/protection: None  Other Topics Concern  . Not on file  Social History Narrative  . Not on  file   Social Determinants of Health   Financial Resource Strain: Not on file  Food Insecurity: Not on file  Transportation Needs: Not on file  Physical Activity: Not on file  Stress: Not on file  Social Connections: Not on file    Family History: Family History  Problem Relation Age of Onset  . Healthy Mother   . Diabetes Maternal Aunt   . Cancer Maternal Grandmother        breast, ovarian  . Cancer Maternal Grandfather        colon    Allergies: Allergies  Allergen Reactions  . Hydrocodone Rash    Medications Prior to Admission  Medication Sig Dispense Refill Last Dose  . acyclovir (ZOVIRAX) 400 MG tablet Take 1 tablet (400 mg total) by mouth 3 (three) times daily. 90 tablet 3 11/21/2020 at Unknown time  . Prenatal Vit-Fe Fumarate-FA (PRENATAL VITAMIN PO) Take by mouth.        Review of Systems   All systems reviewed and negative except as stated in HPI  Blood pressure (!) 131/100, pulse 86, temperature 97.7 F (36.5 C), temperature source Oral, resp. rate 18, height 5\' 4"  (1.626 m), weight  82.6 kg, last menstrual period 02/12/2020, SpO2 100 %. General appearance: alert and cooperative Lungs: normal WOB Heart: regular rate  Abdomen: soft, non-tender Pelvic: No visible lesions or speculum exam. Presentation: cephalic Fetal monitoringBaseline: 140 bpm, Variability: Good {> 6 bpm), Accelerations: Reactive and Decelerations: Absent Uterine activityFrequency: Every 3 minutes Dilation: 7 Effacement (%): 80 Station: -2 Exam by:: jolynn  Prenatal labs: ABO, Rh:   O+ Antibody:   negative Rubella:   Pending RPR:   Pending HBsAg:   non-reactive HIV:   Pending GBS:   Pending (collected in clinic 4/25) 1 hr Glucola: not performed (late to care) Genetic screening: not performed (late to care) Anatomy US- wnl except for fetal mild right renal pelvic dilatation   Prenatal Transfer Tool  Maternal Diabetes: Unknown (no screening given late to prenatal  care) Genetic Screening: not performed (late to prenatal care) Maternal Ultrasounds/Referrals: Fetal Kidney Anomalies Fetal Ultrasounds or other Referrals:  none Maternal Substance Abuse:  No  Significant Maternal Medications:  Meds include: Other: Acyclovir Significant Maternal Lab Results: GBS unknown (collected in clinic on 4/25--pending), maternal h/o HSV with recent recurrent genital outbreak on 4/10. Taking acyclovir with good adherence and no visible lesions/symptoms on admission.  No results found for this or any previous visit (from the past 24 hour(s)).  Patient Active Problem List   Diagnosis Date Noted  . Post-dates pregnancy 11/21/2020  . Uterine size date discrepancy 11/20/2020  . Encounter for supervision of normal pregnancy, antepartum 08/08/2020  . 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 G4P3003 at [redacted]w[redacted]d here for spontaneous onset of labor.  #Labor:  Will manage expectantly. #Pain: Requesting Epidural on admission. #FWB:   Category 1 strip #ID: GBS unknown. Swab collected in clinic on 4/25; result pending. #MOF: Breast #MOC:  POPs #Circ: N/A #HSV: Recurrent genital outbreak 2 weeks ago. Pt reports taking Acyclovir with good adherence since 4/25.  No active lesions on speculum exam today. No report of prodromal symptoms. Discussed with Dr. Adrian Blackwater on arrival. Plan for vaginal delivery. #Late/Limited Prenatal Care:  SW consult in postpartum period  Jovita Kussmaul, MD  11/21/2020, 10:26 AM  Attestation of Supervision of Resident:  I confirm that I have verified the information documented in the resident's student's note and that I have also personally reperformed the history, physical exam and all medical decision making activities.  I have verified that all services and findings are accurately documented in this student's note; and I agree with management and plan as outlined in the documentation. I have also  made any necessary editorial changes.  Sheila Oats, MD [facility], [facility] 11/21/2020 2:17 PM

## 2020-11-21 NOTE — MAU Note (Signed)
feeling really bad contractions. Got her membranes swept yesterday, was 3 cm. Having a little mucous d/c, little blood with it, no rom. +FM.  Measuring small.

## 2020-11-21 NOTE — Discharge Instructions (Signed)

## 2020-11-22 DIAGNOSIS — O9853 Other viral diseases complicating the puerperium: Secondary | ICD-10-CM

## 2020-11-22 LAB — STREP GP B NAA: Strep Gp B NAA: NEGATIVE

## 2020-11-22 LAB — RPR: RPR Ser Ql: NONREACTIVE

## 2020-11-22 LAB — RUBELLA SCREEN: Rubella: 2.7 index (ref >0.99)

## 2020-11-22 MED ORDER — SIMETHICONE 80 MG PO CHEW: 80.0000 mg | CHEWABLE_TABLET | ORAL | 0 refills | Status: DC | PRN

## 2020-11-22 MED ORDER — IBUPROFEN 600 MG PO TABS: 600.0000 mg | ORAL_TABLET | Freq: Four times a day (QID) | ORAL | 0 refills | Status: DC

## 2020-11-22 MED ORDER — COCONUT OIL OIL: 1.0000 "application " | TOPICAL_OIL | 0 refills | Status: DC | PRN

## 2020-11-22 NOTE — Lactation Note (Signed)
This note was copied from a baby's chart. Lactation Consultation Note  Patient Name: Stacy Wall [id number] Date: 11/22/2020 Reason for consult: Follow-up assessment;Term Age:28 hours  LC Consult entered at 1456 today.  Initial LC Consult:  LC consult was entered this afternoon, however, mother is not interested in having any lactation visits.  She is experienced and does not feel a need for assistance.  Mother has coconut oil at bedside for a sore left nipple but no further complaints or questions.  I will remove her from our service.   Maternal Data Has patient been taught Hand Expression?: Yes Does the patient have breastfeeding experience prior to this delivery?: Yes  Feeding Mother's Current Feeding Choice: Breast Milk  LATCH Score Latch: Grasps breast easily, tongue down, lips flanged, rhythmical sucking.  Audible Swallowing: A few with stimulation  Type of Nipple: Everted at rest and after stimulation  Comfort (Breast/Nipple): Filling, red/small blisters or bruises, mild/mod discomfort  Hold (Positioning): No assistance needed to correctly position infant at breast.  LATCH Score: 8   Lactation Tools Discussed/Used    Interventions    Discharge    Consult Status Consult Status: Complete    Cary Wilford R Cinque Begley 11/22/2020, 4:01 PM

## 2020-11-22 NOTE — Clinical Social Work Maternal (Signed)
CLINICAL SOCIAL WORK MATERNAL/CHILD NOTE  Patient Details  Name: Stacy Wall MRN: [id number] Date of Birth: 10/12/1990  Date:  11/22/2020  Clinical Social Worker Initiating Note:  Chaney Johnson, MSW, LCSWA Date/Time: Initiated:  11/22/20/0945     Child's Name:  Stacy Wall   Biological Parents:  Mother,Father (Stacy Wall 07/05/1999)   Need for Interpreter:  None   Reason for Referral:  Late or No Prenatal Care    Address:  [address] [place] [address]    Phone number:  [phone] (home)     Additional phone number:   Household Members/Support Persons (HM/SP):   Household Member/Support Person 1,Household Member/Support Person 2,Household Member/Support Person 3,Household Member/Support Person 4   HM/SP Name Relationship DOB or Age  HM/SP -1 Stacy Wall Significant Other 07/05/1999  HM/SP -2 Stacy Wall Daughter 08/13/2018  HM/SP -3 Stacy Wall Daughter 12/30/2010  HM/SP -4 Stacy Wall Son 08/09/2009  HM/SP -5        HM/SP -6        HM/SP -7        HM/SP -8          Natural Supports (not living in the home):  Spouse/significant other,Extended Family   Professional Supports: None   Employment: Unemployed   Type of Work:     Education:  9 to 11 years   Homebound arranged:    Financial Resources:  Other(Comment) (None)   Other Resources:  WIC   Cultural/Religious Considerations Which May Impact Care:    Strengths:  Ability to meet basic needs ,Pediatrician chosen,Home prepared for child    Psychotropic Medications:         Pediatrician:    [place]  Pediatrician List:   [place]    [place]     [place]    [place] Other ([facility])  [place]    [place]      Pediatrician Fax Number:    Risk Factors/Current Problems:  None   Cognitive State:  Linear Thinking ,Insightful ,Alert    Mood/Affect:  Interested ,Bright ,Happy    CSW Assessment: CSW  consulted for limited prenatal care. CSW met with [name] to complete assessment and offer support. CSW observed infant sleeping in bassinet. [name] was pleasant and welcoming to CSW. CSW informed [name] of reason for consult. [name] was understanding and reported she had limited prenatal care due to not having insurance. [name] stated she is unable to get insurance due to not having a social security number. [name] shared she attended ultrasound appointments and went to the doctor at the end of pregnancy, paying out of pocket. CSW expressed understanding and informed [name] of the hospital drug screen policy. [name] aware infant UDS is negative and a CPS report will be made infant CDS test positive for substances. [name] expressed understanding and denies substance use or CPS history.  [name] reported she lives with FOB and her children. [name] receives WIC food stamps and is aware she can have infant added to benefits. [name] denies any mental health diagnosis and reported she had a good pregnancy. [name] reported she is currently doing well and denies any SI, HI or being involved in any DV. [name] identified FOB and his mother as primary supports.   CSW provided education regarding the baby blues period versus PPD and provided resources. CSW provided the New Mom Checklist and encouraged [name] to self evaluate and contact a medical professional if symptoms are noted at any time.  CSW provided review of Sudden   Infant Death Syndrome (SIDS) precautions. [name] reported she has all essentials for infant, including a crib for safe sleep. [name] denies any barriers to follow-up care. [name] expressed she has no additional needs at this time.   CSW will continue to follow CDS and make a CPS report if warranted. CSW identifies no further need for intervention and no barriers to discharge at this time.  CSW Plan/Description:  No Further Intervention Required/No Barriers to Discharge,CSW Will Continue to Monitor Umbilical Cord Tissue Drug Screen Results and Make  Report if Warranted,Child Protective Service Report ,Hospital Drug Screen Policy Information,Perinatal Mood and Anxiety Disorder (PMADs) Education,Sudden Infant Death Syndrome (SIDS) Education,Other Information/Referral to Community Resources,Other Patient/Family Education    Chaney J Johnson, LCSWA 11/22/2020, 10:06 AM  

## 2020-11-23 NOTE — Discharge Summary (Addendum)
Postpartum Discharge Summary    Patient Name: Stacy Wall DOB: May 12, 1991 MRN: [id number]  Date of admission: 11/21/2020 Delivery date:11/21/2020  Delivering provider: Randa Ngo  Date of discharge: 11/23/2020  Admitting diagnosis: Post-dates pregnancy [O48.0] Intrauterine pregnancy: [redacted]w[redacted]d    Secondary diagnosis:  Principal Problem:   Vaginal delivery Active Problems:   HSV-2 (herpes simplex virus 2) infection   Post-dates pregnancy  Additional problems: as noted above  Discharge diagnosis: Term Pregnancy Delivered                                              Post partum procedures:N/A Augmentation: none Complications: None  Hospital course: Onset of Labor With Vaginal Delivery      30y.o. yo [id number] 445w3das admitted in Active Labor on 11/21/2020. Of note, given recurrent genital HSV outbreak on 11/05/20, speculum exam was performed on admission without evidence of lesions. Pt reported good adherence to acyclovir and no report of concerning symptoms. Patient had an uncomplicated labor course as follows:  Membrane Rupture Time/Date: 11:15 AM ,11/21/2020   Delivery Method:Vaginal, Spontaneous  Episiotomy: None  Lacerations:  None  Patient had an uncomplicated postpartum course. She is ambulating, tolerating a regular diet, passing flatus, and urinating well. Patient is discharged home in stable condition on 11/23/20.  Newborn Data: Birth date:11/21/2020  Birth time:11:27 AM  Gender:Female  Living status:Living  Apgars:8 ,9  Weight:3118 g   Magnesium Sulfate received: No BMZ received: No Rhophylac:N/A MMR:No T-DaP:Declinedoffered prior to discharge Flu: N/A Transfusion:No  Physical exam  Vitals:   11/22/20 1304 11/22/20 1440 11/22/20 2035 11/23/20 0616  BP: 133/85 (!) 105/54 133/88 (!) 120/91  Pulse: 68 84  64  Resp: 17 16 16 18   Temp: 98.1 F (36.7 C) 98.9 F (37.2 C) 97.8 F (36.6 C) 97.9 F (36.6 C)  TempSrc: Oral Oral Oral Oral  SpO2: 100% 99%  100% 100%  Weight:      Height:       General: alert, cooperative and no distress Lochia: appropriate Uterine Fundus: firm Incision: N/A DVT Evaluation: No evidence of DVT seen on physical exam. Labs: Lab Results  Component Value Date   WBC 14.7 (H) 11/21/2020   HGB 12.6 11/21/2020   HCT 36.8 11/21/2020   MCV 90.9 11/21/2020   PLT 204 11/21/2020   CMP Latest Ref Rng & Units 09/07/2018  Glucose 70 - 99 mg/dL 98  BUN 6 - 20 mg/dL 13  Creatinine 0.44 - 1.00 mg/dL 0.63  Sodium 135 - 145 mmol/L 138  Potassium 3.5 - 5.1 mmol/L 3.5  Chloride 98 - 111 mmol/L 103  CO2 22 - 32 mmol/L 25  Calcium 8.9 - 10.3 mg/dL 8.9  Total Protein 6.5 - 8.1 g/dL 7.3  Total Bilirubin 0.3 - 1.2 mg/dL <0.1(L)  Alkaline Phos 38 - 126 U/L 110  AST 15 - 41 U/L 29  ALT 0 - 44 U/L 36   Edinburgh Score: Edinburgh Postnatal Depression Scale Screening Tool 11/21/2020  I have been able to laugh and see the funny side of things. 0  I have looked forward with enjoyment to things. 0  I have blamed myself unnecessarily when things went wrong. 0  I have been anxious or worried for no good reason. 0  I have felt scared or panicky for no good reason. 0  Things have been getting  on top of me. 0  I have been so unhappy that I have had difficulty sleeping. 0  I have felt sad or miserable. 0  I have been so unhappy that I have been crying. 0  The thought of harming myself has occurred to me. 0  Edinburgh Postnatal Depression Scale Total 0     After visit meds:  Allergies as of 11/23/2020      Reactions   Hydrocodone Rash      Medication List    TAKE these medications   acyclovir 400 MG tablet Commonly known as: ZOVIRAX Take 1 tablet (400 mg total) by mouth 3 (three) times daily.   coconut oil Oil Apply 1 application topically as needed.   ibuprofen 600 MG tablet Commonly known as: ADVIL Take 1 tablet (600 mg total) by mouth every 6 (six) hours.   PRENATAL VITAMIN PO Take by mouth.   simethicone 80  MG chewable tablet Commonly known as: MYLICON Chew 1 tablet (80 mg total) by mouth as needed for flatulence.        Discharge home in stable condition Infant Feeding: Breast Infant Disposition:home with mother Discharge instruction: per After Visit Summary and Postpartum booklet. Activity: Advance as tolerated. Pelvic rest for 6 weeks.  Diet: routine diet Future Appointments: Future Appointments  Date Time Provider [place]  12/28/2020 11:50 AM Cresenzo-Dishmon, Joaquim Lai, CNM [facility]   Follow up Visit: Message sent to [facility] on 4/26 by Dr. Astrid Drafts.  Please schedule this patient for a In person postpartum visit in 6 weeks with the following provider: Any provider. Additional Postpartum F/U:none  Low risk pregnancy complicated by: recurrent HSV outbreak on 11/05/20 Delivery mode:  Vaginal, Spontaneous  Anticipated Birth Control:  POPs   11/23/2020 Delora Fuel, MD   I saw and evaluated the patient. I agree with the findings and the plan of care as documented in the resident's note.  Sharene Skeans, MD [facility] Family Medicine Fellow, [facility] for [facility], [place]

## 2020-11-25 ENCOUNTER — Inpatient Hospital Stay (HOSPITAL_COMMUNITY): Admission: AD | Admit: 2020-11-25 | Payer: Self-pay | Source: Home / Self Care | Admitting: Family Medicine

## 2020-11-25 ENCOUNTER — Inpatient Hospital Stay (HOSPITAL_COMMUNITY): Payer: Self-pay

## 2020-11-25 ENCOUNTER — Encounter (HOSPITAL_COMMUNITY): Payer: Self-pay

## 2020-12-28 ENCOUNTER — Ambulatory Visit: Payer: Self-pay | Admitting: Advanced Practice Midwife

## 2021-01-03 ENCOUNTER — Other Ambulatory Visit: Payer: Self-pay

## 2021-01-03 ENCOUNTER — Emergency Department (HOSPITAL_COMMUNITY)
Admission: EM | Admit: 2021-01-03 | Discharge: 2021-01-03 | Disposition: A | Discharge status: Home / Self Care | Payer: Self-pay | Attending: Emergency Medicine | Admitting: Emergency Medicine

## 2021-01-03 ENCOUNTER — Emergency Department (HOSPITAL_COMMUNITY): Payer: Self-pay

## 2021-01-03 ENCOUNTER — Encounter (HOSPITAL_COMMUNITY): Payer: Self-pay | Admitting: *Deleted

## 2021-01-03 DIAGNOSIS — R1013 Epigastric pain: Secondary | ICD-10-CM | POA: Insufficient documentation

## 2021-01-03 DIAGNOSIS — R531 Weakness: Secondary | ICD-10-CM | POA: Insufficient documentation

## 2021-01-03 DIAGNOSIS — R1011 Right upper quadrant pain: Secondary | ICD-10-CM

## 2021-01-03 DIAGNOSIS — K8020 Calculus of gallbladder without cholecystitis without obstruction: Secondary | ICD-10-CM

## 2021-01-03 DIAGNOSIS — Z28310 Unvaccinated for covid-19: Secondary | ICD-10-CM | POA: Insufficient documentation

## 2021-01-03 DIAGNOSIS — R197 Diarrhea, unspecified: Secondary | ICD-10-CM | POA: Insufficient documentation

## 2021-01-03 LAB — CBC WITH DIFFERENTIAL/PLATELET
Abs Immature Granulocytes: 0.02 10*3/uL (ref 0.00–0.07)
Basophils Absolute: 0 10*3/uL (ref 0.0–0.1)
Basophils Relative: 0 %
Eosinophils Absolute: 0.1 10*3/uL (ref 0.0–0.5)
Eosinophils Relative: 1 %
HCT: 41.2 % (ref 36.0–46.0)
Hemoglobin: 13.5 g/dL (ref 12.0–15.0)
Immature Granulocytes: 0 %
Lymphocytes Relative: 35 %
Lymphs Abs: 2.3 10*3/uL (ref 0.7–4.0)
MCH: 30.1 pg (ref 26.0–34.0)
MCHC: 32.8 g/dL (ref 30.0–36.0)
MCV: 91.8 fL (ref 80.0–100.0)
Monocytes Absolute: 0.6 10*3/uL (ref 0.1–1.0)
Monocytes Relative: 9 %
Neutro Abs: 3.6 10*3/uL (ref 1.7–7.7)
Neutrophils Relative %: 55 %
Platelets: 214 10*3/uL (ref 150–400)
RBC: 4.49 MIL/uL (ref 3.87–5.11)
RDW: 12.4 % (ref 11.5–15.5)
WBC: 6.6 10*3/uL (ref 4.0–10.5)
nRBC: 0 % (ref 0.0–0.2)

## 2021-01-03 LAB — URINALYSIS, ROUTINE W REFLEX MICROSCOPIC
Bilirubin Urine: NEGATIVE
Glucose, UA: NEGATIVE mg/dL
Hgb urine dipstick: NEGATIVE
Ketones, ur: NEGATIVE mg/dL
Leukocytes,Ua: NEGATIVE
Nitrite: NEGATIVE
Protein, ur: NEGATIVE mg/dL
Specific Gravity, Urine: 1.027 (ref 1.005–1.030)
pH: 6 (ref 5.0–8.0)

## 2021-01-03 LAB — COMPREHENSIVE METABOLIC PANEL
ALT: 34 U/L (ref 0–44)
AST: 24 U/L (ref 15–41)
Albumin: 4.3 g/dL (ref 3.5–5.0)
Alkaline Phosphatase: 100 U/L (ref 38–126)
Anion gap: 7 (ref 5–15)
BUN: 13 mg/dL (ref 6–20)
CO2: 28 mmol/L (ref 22–32)
Calcium: 8.9 mg/dL (ref 8.9–10.3)
Chloride: 106 mmol/L (ref 98–111)
Creatinine, Ser: 0.62 mg/dL (ref 0.44–1.00)
GFR, Estimated: >60 mL/min (ref >60)
Glucose, Bld: 86 mg/dL (ref 70–99)
Potassium: 3.8 mmol/L (ref 3.5–5.1)
Sodium: 141 mmol/L (ref 135–145)
Total Bilirubin: 0.4 mg/dL (ref 0.3–1.2)
Total Protein: 7.8 g/dL (ref 6.5–8.1)

## 2021-01-03 LAB — I-STAT BETA HCG BLOOD, ED (MC, WL, AP ONLY): I-stat hCG, quantitative: <5 m[IU]/mL (ref <5)

## 2021-01-03 LAB — LIPASE, BLOOD: Lipase: 25 U/L (ref 11–51)

## 2021-01-03 NOTE — ED Provider Notes (Signed)
Emergency Medicine Provider Triage Evaluation Note  Stacy Wall , a 30 y.o. female  was evaluated in triage.  Pt complains of epigastric pain x2 days.  Also endorses nonbloody diarrhea and generalized weakness.  Patient states she was told she had gallstones x10 years ago.  Admits to eating fatty food.  Denies any alcohol consumption.  Review of Systems  Positive: Abdominal pain, diarrhea Negative: Fever, chills, nausea or emesis  Physical Exam  BP 118/87 (BP Location: Right Arm)   Pulse 78   Temp 99.6 F (37.6 C) (Oral)   Resp 18   LMP 11/30/2020   SpO2 96%  Gen:   Awake, no distress   Resp:  Normal effort  MSK:   Moves extremities without difficulty  Other:  No abdominal tenderness on exam.  Medical Decision Making  Medically screening exam initiated at 2:56 PM.  Appropriate orders placed.  Finlee Concepcion was informed that the remainder of the evaluation will be completed by another provider, this initial triage assessment does not replace that evaluation, and the importance of remaining in the ED until their evaluation is complete.  Abdominal labs and upper quadrant ultrasound ordered.   Portions of this note were generated with Scientist, clinical (histocompatibility and immunogenetics). Dictation errors may occur despite best attempts at proofreading.    Shanon Ace, PA-C 01/03/21 1456    Linwood Dibbles, MD 01/04/21 [phone]

## 2021-01-03 NOTE — Discharge Instructions (Signed)
Your pain is likely due to gallstone.  However your labs are reassuring and your ultrasound without concerning finding.  Please call and follow-up closely with the surgeon for further evaluation of your condition.  Avoids eating fatty food as it may trigger the pain.  Return to the ED promptly if condition worsen

## 2021-01-03 NOTE — ED Provider Notes (Signed)
[facility]-EMERGENCY DEPT Provider Note   CSN: [id number] Arrival date & time: 01/03/21  1434     History Chief Complaint  Patient presents with  . Abdominal Pain    Stacy Wall is a 30 y.o. female.  The history is provided by the patient and medical records. No language interpreter was used.  Abdominal Pain    30 year old female significant history of cholelithiasis, who presents for evaluation of abdominal pain.  Patient report for the past several days she has had epigastric abdominal pain.  Pain is described as an intermittent sharp crampy sensation that can be intense at time.  And she also endorsed feeling very fatigued, having diarrhea as well as weakness.  No fever runny nose sneezing coughing sore throat loss of taste or smell or dysuria.  No recent sick contact.  Pain is waxing waning and and at this time pain is very minimal.  She mention having history of gallstones in the past and this felt similar.  She has not been vaccinated for COVID-19.  She denies tobacco or alcohol use.  Past Medical History:  Diagnosis Date  . Chronic back pain   . Gallstones   . Herpes   . Medical history non-contributory     Patient Active Problem List   Diagnosis Date Noted  . Post-dates pregnancy 11/21/2020  . Uterine size date discrepancy 11/20/2020  . Encounter for supervision of normal pregnancy, antepartum 08/08/2020  . Vaginal delivery 08/02/2018  . 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 NO PAST SURGERIES       OB History    Gravida  4   Para  4   Term  4   Preterm      AB      Living  4     SAB      IAB      Ectopic      Multiple  0   Live Births  4           Family History  Problem Relation Age of Onset  . Healthy Mother   . Diabetes Maternal Aunt   . Cancer Maternal Grandmother        breast, ovarian  . Cancer Maternal Grandfather        colon     Social History   Tobacco Use  . Smoking status: Never Smoker  . Smokeless tobacco: Never Used  Vaping Use  . Vaping Use: Never used  Substance Use Topics  . Alcohol use: No  . Drug use: No    Home Medications Prior to Admission medications   Medication Sig Start Date End Date Taking? Authorizing Provider  acyclovir (ZOVIRAX) 400 MG tablet Take 1 tablet (400 mg total) by mouth 3 (three) times daily. 11/20/20   Cheral Marker, CNM  coconut oil OIL Apply 1 application topically as needed. 11/22/20   Maness, Loistine Chance, MD  ibuprofen (ADVIL) 600 MG tablet Take 1 tablet (600 mg total) by mouth every 6 (six) hours. 11/22/20   Maness, Loistine Chance, MD  Prenatal Vit-Fe Fumarate-FA (PRENATAL VITAMIN PO) Take by mouth.    [provider]  simethicone (MYLICON) 80 MG chewable tablet Chew 1 tablet (80 mg total) by mouth as needed for flatulence. 11/22/20   Jovita Kussmaul, MD    Allergies    Hydrocodone  Review of Systems   Review of Systems  Gastrointestinal: Positive for abdominal pain.  All  other systems reviewed and are negative.   Physical Exam Updated Vital Signs BP 118/87 (BP Location: Right Arm)   Pulse 78   Temp 99.6 F (37.6 C) (Oral)   Resp 18   LMP 11/30/2020   SpO2 96%   Physical Exam Vitals and nursing note reviewed.  Constitutional:      General: She is not in acute distress.    Appearance: She is well-developed.  HENT:     Head: Atraumatic.  Eyes:     Conjunctiva/sclera: Conjunctivae normal.  Cardiovascular:     Rate and Rhythm: Normal rate and regular rhythm.     Heart sounds: Normal heart sounds.  Pulmonary:     Effort: Pulmonary effort is normal.     Breath sounds: Normal breath sounds.  Abdominal:     General: Abdomen is flat.     Palpations: Abdomen is soft.     Tenderness: There is abdominal tenderness in the epigastric area. There is no guarding or rebound. Negative signs include Murphy's sign and McBurney's sign.     Hernia: No hernia is  present.  Musculoskeletal:     Cervical back: Neck supple.  Skin:    Coloration: Skin is not jaundiced.     Findings: No rash.  Neurological:     Mental Status: She is alert.  Psychiatric:        Mood and Affect: Mood normal.     ED Results / Procedures / Treatments   Labs (all labs ordered are listed, but only abnormal results are displayed) Labs Reviewed  COMPREHENSIVE METABOLIC PANEL  LIPASE, BLOOD  CBC WITH DIFFERENTIAL/PLATELET  URINALYSIS, ROUTINE W REFLEX MICROSCOPIC  I-STAT BETA HCG BLOOD, ED ([facility], [facility], [facility] ONLY)    EKG None  Radiology US Abdomen Limited RUQ (LIVER/GB)  Result Date: 01/03/2021 CLINICAL DATA:  RIGHT upper quadrant abdominal pain, history of gallstones EXAM: ULTRASOUND ABDOMEN LIMITED RIGHT UPPER QUADRANT COMPARISON:  09/07/2018 FINDINGS: Gallbladder: Gallbladder filled with multiple calculi. No gallbladder wall thickening or pericholecystic fluid. Sonographic Eulah Pont sign is present. Common bile duct: Diameter: 6 mm, borderline dilated. Only a short segment at the porta hepatis is visualized. Liver: Normal echogenicity without mass or nodularity. Portal vein is patent on color Doppler imaging with normal direction of blood flow towards the liver. Other: No RIGHT upper quadrant free fluid. IMPRESSION: Cholelithiasis with presence of a sonographic Murphy sign, though no definite gallbladder wall thickening or pericholecystic fluid are identified. Early acute cholecystitis not excluded. Borderline dilatation of CBD, 6 mm diameter, recommend correlation with LFTs. Electronically Signed   By: Ulyses Southward M.D.   On: 01/03/2021 16:42    Procedures Procedures   Medications Ordered in ED Medications - No data to display  ED Course  I have reviewed the triage vital signs and the nursing notes.  Pertinent labs & imaging results that were available during my care of the patient were reviewed by me and considered in my medical decision making (see chart for  details).    MDM Rules/Calculators/A&P                          BP 113/84   Pulse 72   Temp 99.6 F (37.6 C) (Oral)   Resp 16   LMP 11/30/2020   SpO2 99%   Final Clinical Impression(s) / ED Diagnoses Final diagnoses:  RUQ abdominal pain    Rx / DC Orders ED Discharge Orders    None     3:59  PM Patient presents complaining of epigastric abdominal pain for the past 2 days.  Also report history of gallstones in the past and states that this pain feels similar.  She report minimal pain at this time.  She does have some epigastric tenderness on exam but no significant tenderness to right upper quadrant.  I did offer to do a COVID test as well however patient declined.  5:42 PM Pregnancy test is negative, labs are reassuring, normal lipase, normal liver function panel, normal WBC.  Limited abdominal ultrasound demonstrate evidence of cholelithiasis with presence of sonographic Murphy sign although there is no definitive gallbladder wall thickening or pericholecystic fluid.  Although early acute cholecystitis is not excluded, on reassessment patient without any pain and overall feeling fine.  Plan to have patient follow-up outpatient with [facility] surgery for further care.  Recommend avoid fatty food in the meantime and patient given strict return precaution.  She voiced understanding and agrees with plan.   Fayrene Helper, PA-C 01/03/21 1748    Mancel Bale, MD 01/04/21 [phone]

## 2021-01-03 NOTE — ED Triage Notes (Signed)
Pt complains of abdominal pain, diarrhea, weakness x 2 days.

## 2021-03-16 ENCOUNTER — Other Ambulatory Visit: Payer: Self-pay

## 2021-03-16 ENCOUNTER — Ambulatory Visit (INDEPENDENT_AMBULATORY_CARE_PROVIDER_SITE_OTHER): Payer: Self-pay | Admitting: Nurse Practitioner

## 2021-03-16 ENCOUNTER — Encounter (INDEPENDENT_AMBULATORY_CARE_PROVIDER_SITE_OTHER): Payer: Self-pay | Admitting: Nurse Practitioner

## 2021-03-16 VITALS — BP 130/92 | HR 73 | Temp 97.5°F | Ht 64.0 in | Wt 160.6 lb

## 2021-03-16 DIAGNOSIS — K8020 Calculus of gallbladder without cholecystitis without obstruction: Secondary | ICD-10-CM | POA: Insufficient documentation

## 2021-03-16 NOTE — Progress Notes (Signed)
@Patient  ID: , female    DOB: [birth year], 30 y.o.   MRN: [id number]  Chief Complaint  Patient presents with   New Patient (Initial Visit)    gallstones    Referring provider: Health, [id number]*  HPI  Patient presents today to establish care with Matthew Folks.  She was recently seen in the ED on 01/03/2021 for cholelithiasis.  Patient states that this has been an ongoing issue for several years.  She continues to have abdominal discomfort and nausea when eating certain foods.  It was recommended by ED physician that she be referred to general surgery.  Patient prefers to be referred to general surgery within the [facility] such [facility].  Patient denies any other significant health history and is not currently on any medications.  She has recently had a physical through the health department. Denies f/c/s, hemoptysis, PND, chest pain or edema.      Allergies  Allergen Reactions   Hydrocodone Rash    There is no immunization history for the selected administration types on file for this patient.  Past Medical History:  Diagnosis Date   Chronic back pain    Gallstones    Herpes    Medical history non-contributory     Tobacco History: Social History   Tobacco Use  Smoking Status Never  Smokeless Tobacco Never   Counseling given: Not Answered   Outpatient Encounter Medications as of 03/16/2021  Medication Sig   acyclovir (ZOVIRAX) 400 MG tablet Take 1 tablet (400 mg total) by mouth 3 (three) times daily.   [DISCONTINUED] coconut oil OIL Apply 1 application topically as needed.   [DISCONTINUED] ibuprofen (ADVIL) 600 MG tablet Take 1 tablet (600 mg total) by mouth every 6 (six) hours.   [DISCONTINUED] Prenatal Vit-Fe Fumarate-FA (PRENATAL VITAMIN PO) Take by mouth.   [DISCONTINUED] simethicone (MYLICON) 80 MG chewable tablet Chew 1 tablet (80 mg total) by mouth as needed for flatulence.   No facility-administered encounter  medications on file as of 03/16/2021.     Review of Systems  Review of Systems  Constitutional: Negative.   HENT: Negative.    Respiratory:  Negative for cough and shortness of breath.   Cardiovascular: Negative.   Gastrointestinal:  Positive for abdominal pain and nausea. Negative for abdominal distention.  Allergic/Immunologic: Negative.   Neurological: Negative.   Psychiatric/Behavioral: Negative.        Physical Exam  BP (!) 130/92 (BP Location: Right Arm, Patient Position: Sitting, Cuff Size: Normal)   Pulse 73   Temp (!) 97.5 F (36.4 C) (Temporal)   Ht 5\' 4"  (1.626 m)   Wt 160 lb 9.6 oz (72.8 kg)   SpO2 97%   Breastfeeding Yes   BMI 27.57 kg/m   Wt Readings from Last 5 Encounters:  03/16/21 160 lb 9.6 oz (72.8 kg)  11/21/20 182 lb (82.6 kg)  11/20/20 180 lb (81.6 kg)  10/09/20 169 lb 6.4 oz (76.8 kg)  07/06/20 148 lb 9.6 oz (67.4 kg)     Physical Exam Vitals and nursing note reviewed.  Constitutional:      General: She is not in acute distress.    Appearance: She is well-developed.  Cardiovascular:     Rate and Rhythm: Normal rate and regular rhythm.  Pulmonary:     Effort: Pulmonary effort is normal.     Breath sounds: Normal breath sounds.  Abdominal:     General: There is no distension.     Tenderness: There is  no abdominal tenderness. There is no guarding.  Neurological:     Mental Status: She is alert and oriented to person, place, and time.      Assessment & Plan:   Calculus of gallbladder without cholecystitis without obstruction Cholelithiasis:  Will place referral to General surgery per ED recommendations  Stay active  Stay well hydrated  Follow up:  Follow up after seen by surgery     Ivonne Andrew, NP 03/16/2021

## 2021-03-16 NOTE — Patient Instructions (Addendum)
Cholelithiasis:  Will place referral to General surgery per ED recommendations  Stay active  Stay well hydrated  Follow up:  Follow up after seen by surgery   Cholelithiasis  Cholelithiasis happens when gallstones form in the gallbladder. The gallbladder stores bile. Bile is a fluid that helps digest fats. Bile can harden and form into gallstones. If they cause a blockage, they can cause pain (gallbladder attack). What are the causes? This condition may be caused by: Some blood diseases, such as sickle cell anemia. Too much of a fat-like substance (cholesterol) in your bile. Not enough bile salts in your bile. These salts help the body absorb and digest fats. The gallbladder not emptying fully or often enough. This is common in pregnant women. What increases the risk? The following factors may make you more likely to develop this condition: Being female. Being pregnant many times. Eating a lot of fried foods, fat, and refined carbs (refined carbohydrates). Being very overweight (obese). Being older than age 84. Using medicines with female hormones in them for a long time. Losing weight fast. Having gallstones in your family. Having some health problems, such as diabetes, Crohn's disease, or liver disease. What are the signs or symptoms? Often, there may be gallstones but no symptoms. These gallstones are called silent gallstones. If a gallstone causes a blockage, you may get sudden pain. The pain: Can be in the upper right part of your belly (abdomen). Normally comes at night or after you eat. Can last an hour or more. Can spread to your right shoulder, back, or chest. Can feel like discomfort, burning, or fullness in the upper part of your belly (indigestion). If the blockage lasts more than a few hours, you can get an infection or swelling. You may: Feel like you may vomit. Vomit. Feel bloated. Have belly pain for 5 hours or more. Feel tender in your belly, often in the  upper right part and under your ribs. Have fever or chills. Have skin or the white parts of your eyes turn yellow (jaundice). Have dark pee (urine) or pale poop (stool). How is this treated? Treatment for this condition depends on how bad you feel. If you have symptoms, you may need: Home care, if symptoms are not very bad. Do not eat for 12-24 hours. Drink only water and clear liquids. Start to eat simple or clear foods after 1 or 2 days. Try broths and crackers. You may need medicines for pain or stomach upset or both. If you have an infection, you will need antibiotics. A hospital stay, if you have very bad pain or a very bad infection. Surgery to remove your gallbladder. You may need this if: Gallstones keep coming back. You have very bad symptoms. Medicines to break up gallstones. Medicines: Are best for small gallstones. May be used for up to 6-12 months. A procedure to find and take out gallstones or to break up gallstones. Follow these instructions at home: Medicines Take over-the-counter and prescription medicines only as told by your doctor. If you were prescribed an antibiotic medicine, take it as told by your doctor. Do not stop taking the antibiotic even if you start to feel better. Ask your doctor if the medicine prescribed to you requires you to avoid driving or using machinery. Eating and drinking Drink enough fluid to keep your urine pale yellow. Drink water or clear fluids. This is important when you have pain. Eat healthy foods. Choose: Fewer fatty foods, such as fried foods. Fewer refined carbs. Avoid  breads and grains that are highly processed, such as white bread and white rice. Choose whole grains, such as whole-wheat bread and brown rice. More fiber. Almonds, fresh fruit, and beans are healthy sources. General instructions Keep a healthy weight. Keep all follow-up visits as told by your doctor. This is important. Where to find more information [employer] of Diabetes and Digestive and Kidney Diseases: CarFli[employer]tn Contact a doctor if: You have sudden pain in the upper right part of your belly. Pain might spread to your right shoulder, back, or chest. You have been diagnosed with gallstones that have no symptoms and you get: Belly pain. Discomfort, burning, or fullness in the upper part of your abdomen. You have dark urine or pale stools. Get help right away if: You have sudden pain in the upper right part of your abdomen, and the pain lasts more than 2 hours. You have pain in your abdomen, and: It lasts more than 5 hours. It keeps getting worse. You have a fever or chills. You keep feeling like you may vomit. You keep vomiting. Your skin or the white parts of your eyes turn yellow. Summary Cholelithiasis happens when gallstones form in the gallbladder. This condition may be caused by a blood disease, too much of a fat-like substance in the bile, or not enough bile salts in bile. Treatment for this condition depends on how bad you feel. If you have symptoms, do not eat or drink. You may need medicines. You may need a hospital stay for very bad pain or a very bad infection. You may need surgery if gallstones keep coming back or if you have very bad symptoms. This information is not intended to replace advice given to you by your health care provider. Make sure you discuss any questions you have with your healthcare provider. Document Revised: 09/03/2019 Document Reviewed: 06/07/2019 Elsevier Patient Education  2022 [employer].

## 2021-03-16 NOTE — Assessment & Plan Note (Signed)
Cholelithiasis:  Will place referral to General surgery per ED recommendations  Stay active  Stay well hydrated  Follow up:  Follow up after seen by surgery

## 2021-03-27 ENCOUNTER — Ambulatory Visit (INDEPENDENT_AMBULATORY_CARE_PROVIDER_SITE_OTHER): Payer: Self-pay | Admitting: Surgery

## 2021-03-27 ENCOUNTER — Encounter: Payer: Self-pay | Admitting: Surgery

## 2021-03-27 ENCOUNTER — Ambulatory Visit: Payer: Self-pay | Admitting: Surgery

## 2021-03-27 ENCOUNTER — Other Ambulatory Visit: Payer: Self-pay

## 2021-03-27 ENCOUNTER — Other Ambulatory Visit
Admission: RE | Admit: 2021-03-27 | Discharge: 2021-03-27 | Disposition: A | Discharge status: Home / Self Care | Payer: Self-pay | Attending: Surgery | Admitting: Surgery

## 2021-03-27 ENCOUNTER — Telehealth: Payer: Self-pay | Admitting: Surgery

## 2021-03-27 VITALS — BP 132/80 | HR 91 | Temp 98.0°F | Ht 64.0 in | Wt 161.0 lb

## 2021-03-27 DIAGNOSIS — K8010 Calculus of gallbladder with chronic cholecystitis without obstruction: Secondary | ICD-10-CM

## 2021-03-27 DIAGNOSIS — K8020 Calculus of gallbladder without cholecystitis without obstruction: Secondary | ICD-10-CM | POA: Insufficient documentation

## 2021-03-27 LAB — CBC WITH DIFFERENTIAL/PLATELET
Abs Immature Granulocytes: 0.03 10*3/uL (ref 0.00–0.07)
Basophils Absolute: 0.1 10*3/uL (ref 0.0–0.1)
Basophils Relative: 1 %
Eosinophils Absolute: 0.1 10*3/uL (ref 0.0–0.5)
Eosinophils Relative: 1 %
HCT: 42.4 % (ref 36.0–46.0)
Hemoglobin: 14.7 g/dL (ref 12.0–15.0)
Immature Granulocytes: 0 %
Lymphocytes Relative: 36 %
Lymphs Abs: 3.1 10*3/uL (ref 0.7–4.0)
MCH: 30.6 pg (ref 26.0–34.0)
MCHC: 34.7 g/dL (ref 30.0–36.0)
MCV: 88.3 fL (ref 80.0–100.0)
Monocytes Absolute: 0.4 10*3/uL (ref 0.1–1.0)
Monocytes Relative: 4 %
Neutro Abs: 5.1 10*3/uL (ref 1.7–7.7)
Neutrophils Relative %: 58 %
Platelets: 253 10*3/uL (ref 150–400)
RBC: 4.8 MIL/uL (ref 3.87–5.11)
RDW: 13.5 % (ref 11.5–15.5)
WBC: 8.7 10*3/uL (ref 4.0–10.5)
nRBC: 0 % (ref 0.0–0.2)

## 2021-03-27 LAB — COMPREHENSIVE METABOLIC PANEL
ALT: 19 U/L (ref 0–44)
AST: 20 U/L (ref 15–41)
Albumin: 4.8 g/dL (ref 3.5–5.0)
Alkaline Phosphatase: 87 U/L (ref 38–126)
Anion gap: 10 (ref 5–15)
BUN: 15 mg/dL (ref 6–20)
CO2: 23 mmol/L (ref 22–32)
Calcium: 9.1 mg/dL (ref 8.9–10.3)
Chloride: 105 mmol/L (ref 98–111)
Creatinine, Ser: 0.52 mg/dL (ref 0.44–1.00)
GFR, Estimated: >60 mL/min (ref >60)
Glucose, Bld: 90 mg/dL (ref 70–99)
Potassium: 4 mmol/L (ref 3.5–5.1)
Sodium: 138 mmol/L (ref 135–145)
Total Bilirubin: 0.7 mg/dL (ref 0.3–1.2)
Total Protein: 8.1 g/dL (ref 6.5–8.1)

## 2021-03-27 NOTE — Progress Notes (Signed)
Patient ID: Stacy Wall, female   DOB: Jan 26, 1991, 30 y.o.   MRN: [id number]  Chief Complaint: Gallstones  History of Present Illness Stacy Wall is a 30 y.o. female with history of postprandial/epigastric pain over the years.  It is most recently become more prominent with her last pregnancy, increasing in frequency over the last couple months.  Was seen early June and diagnosed with cholelithiasis.  She reports she has a known well known fatty food intolerance, but seems to tolerate small meals regardless of their make-up.  She denies any recent nausea or vomiting.  She denies diarrhea, reporting mild constipation where she may skip a day.  She reports her bowel movements are usually hard.  She denies any history of acholic stools, scleral icterus or jaundice.  She denies any hematochezia or melena.  She is currently nursing her daughter, her second child.  Past Medical History Past Medical History:  Diagnosis Date   Chronic back pain    Gallstones    Herpes    Medical history non-contributory       Past Surgical History:  Procedure Laterality Date   NO PAST SURGERIES      No Known Allergies  Current Outpatient Medications  Medication Sig Dispense Refill   medroxyPROGESTERone (DEPO-PROVERA) 150 MG/ML injection Inject 150 mg into the muscle every 3 (three) months.     No current facility-administered medications for this visit.    Family History Family History  Problem Relation Age of Onset   Healthy Mother    Diabetes Maternal Aunt    Cancer Maternal Grandmother        breast, ovarian   Cancer Maternal Grandfather        colon      Social History Social History   Tobacco Use   Smoking status: Never   Smokeless tobacco: Never  Vaping Use   Vaping Use: Never used  Substance Use Topics   Alcohol use: No   Drug use: No        Review of Systems  Constitutional: Negative.   HENT: Negative.    Eyes: Negative.   Respiratory: Negative.    Cardiovascular:  Negative.   Gastrointestinal: Negative.   Genitourinary: Negative.   Skin: Negative.   Neurological: Negative.   Psychiatric/Behavioral: Negative.       Physical Exam Weight 161 lb (73 kg), currently breastfeeding. Last Weight  Most recent update: 03/27/2021 10:50 AM    Weight  73 kg (161 lb)             CONSTITUTIONAL: Well developed, and nourished, appropriately responsive and aware without distress.   EYES: Sclera non-icteric.   EARS, NOSE, MOUTH AND THROAT: Mask worn.   Hearing is intact to voice.  NECK: Trachea is midline, and there is no jugular venous distension.  LYMPH NODES:  Lymph nodes in the neck are not enlarged. RESPIRATORY:  Lungs are clear, and breath sounds are equal bilaterally. Normal respiratory effort without pathologic use of accessory muscles. CARDIOVASCULAR: Heart is regular in rate and rhythm. GI: The abdomen is  soft, nontender, and nondistended. There were no palpable masses. I did not appreciate hepatosplenomegaly. There were normal bowel sounds.  MUSCULOSKELETAL:  Symmetrical muscle tone appreciated in all four extremities.    SKIN: Skin turgor is normal. No pathologic skin lesions appreciated.  NEUROLOGIC:  Motor and sensation appear grossly normal.  Cranial nerves are grossly without defect. PSYCH:  Alert and oriented to person, place and time. Affect is appropriate for situation.  Data  Reviewed I have personally reviewed what is currently available of the patient's imaging, recent labs and medical records.   Labs:  CBC Latest Ref Rng & Units 01/03/2021 11/21/2020 09/07/2018  WBC 4.0 - 10.5 K/uL 6.6 14.7(H) 9.7  Hemoglobin 12.0 - 15.0 g/dL 59.9 77.4 14.2  Hematocrit 36.0 - 46.0 % 41.2 36.8 40.7  Platelets 150 - 400 K/uL 214 204 239   CMP Latest Ref Rng & Units 01/03/2021 09/07/2018 02/12/2018  Glucose 70 - 99 mg/dL 86 98 98  BUN 6 - 20 mg/dL 13 13 6   Creatinine 0.44 - 1.00 mg/dL 3.95 3.20)  Sodium 135 - 145 mmol/L 141 138 136  Potassium 3.5  - 5.1 mmol/L 3.8 3.5 3.8  Chloride 98 - 111 mmol/L 106 103 104  CO2 22 - 32 mmol/L 28 25 25   Calcium 8.9 - 10.3 mg/dL 8.9 8.9 9.1  Total Protein 6.5 - 8.1 g/dL 7.8 7.3 6.7  Total Bilirubin 0.3 - 1.2 mg/dL 0.4 <0.1(L) 0.2(L)  Alkaline Phos 38 - 126 U/L 100 110 69  AST 15 - 41 U/L 24 29 22   ALT 0 - 44 U/L 34 36 24      Imaging: Radiology review:  CLINICAL DATA:  RIGHT upper quadrant abdominal pain, history of gallstones   EXAM: ULTRASOUND ABDOMEN LIMITED RIGHT UPPER QUADRANT   COMPARISON:  09/07/2018   FINDINGS: Gallbladder:   Gallbladder filled with multiple calculi. No gallbladder wall thickening or pericholecystic fluid. Sonographic sign is present.   Common bile duct:   Diameter: 6 mm, borderline dilated. Only a short segment at the porta hepatis is visualized.   Liver:   Normal echogenicity without mass or nodularity. Portal vein is patent on color Doppler imaging with normal direction of blood flow towards the liver.   Other: No RIGHT upper quadrant free fluid.   IMPRESSION: Cholelithiasis with presence of a sonographic Murphy sign, though no definite gallbladder wall thickening or pericholecystic fluid are identified.   Early acute cholecystitis not excluded.   Borderline dilatation of CBD, 6 mm diameter, recommend correlation with LFTs.     Electronically Signed   By: M.D.   On: 01/03/2021 16:42 Within last 24 hrs: No results found.  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 This was discussed thoroughly.  Optimal plan is for robotic cholecystectomy.    We discussed repeating her labs today, and the fact that her common bile duct was slightly larger than normal limits possibility of her having common bile duct stones.  We discussed the potential of additional procedures to clear her common bile duct and I believe she understands this.  Risks and benefits have been discussed with the patient which include but are not limited to anesthesia, bleeding, infection, biliary ductal injury or stenosis, other associated unanticipated injuries affiliated with laparoscopic surgery.  I believe there is the desire to proceed, interpreter utilized as needed.  Questions elicited and answered to satisfaction.  No guarantees ever expressed or implied.   Face-to-face time spent with the patient and accompanying care providers(if present) was 25 minutes, with more than 50% of the time spent counseling, educating,  and coordinating care of the patient.    These notes generated with voice recognition software. I apologize for typographical errors.  Campbell Lerner M.D., FACS 03/27/2021, 10:52 AM

## 2021-03-27 NOTE — H&P (View-Only) (Signed)
Patient ID: Stacy Wall, female   DOB: Jan 26, 1991, 30 y.o.   MRN: [id number]  Chief Complaint: Gallstones  History of Present Illness Stacy Wall is a 30 y.o. female with history of postprandial/epigastric pain over the years.  It is most recently become more prominent with her last pregnancy, increasing in frequency over the last couple months.  Was seen early June and diagnosed with cholelithiasis.  She reports she has a known well known fatty food intolerance, but seems to tolerate small meals regardless of their make-up.  She denies any recent nausea or vomiting.  She denies diarrhea, reporting mild constipation where she may skip a day.  She reports her bowel movements are usually hard.  She denies any history of acholic stools, scleral icterus or jaundice.  She denies any hematochezia or melena.  She is currently nursing her daughter, her second child.  Past Medical History Past Medical History:  Diagnosis Date   Chronic back pain    Gallstones    Herpes    Medical history non-contributory       Past Surgical History:  Procedure Laterality Date   NO PAST SURGERIES      No Known Allergies  Current Outpatient Medications  Medication Sig Dispense Refill   medroxyPROGESTERone (DEPO-PROVERA) 150 MG/ML injection Inject 150 mg into the muscle every 3 (three) months.     No current facility-administered medications for this visit.    Family History Family History  Problem Relation Age of Onset   Healthy Mother    Diabetes Maternal Aunt    Cancer Maternal Grandmother        breast, ovarian   Cancer Maternal Grandfather        colon      Social History Social History   Tobacco Use   Smoking status: Never   Smokeless tobacco: Never  Vaping Use   Vaping Use: Never used  Substance Use Topics   Alcohol use: No   Drug use: No        Review of Systems  Constitutional: Negative.   HENT: Negative.    Eyes: Negative.   Respiratory: Negative.    Cardiovascular:  Negative.   Gastrointestinal: Negative.   Genitourinary: Negative.   Skin: Negative.   Neurological: Negative.   Psychiatric/Behavioral: Negative.       Physical Exam Weight 161 lb (73 kg), currently breastfeeding. Last Weight  Most recent update: 03/27/2021 10:50 AM    Weight  73 kg (161 lb)             CONSTITUTIONAL: Well developed, and nourished, appropriately responsive and aware without distress.   EYES: Sclera non-icteric.   EARS, NOSE, MOUTH AND THROAT: Mask worn.   Hearing is intact to voice.  NECK: Trachea is midline, and there is no jugular venous distension.  LYMPH NODES:  Lymph nodes in the neck are not enlarged. RESPIRATORY:  Lungs are clear, and breath sounds are equal bilaterally. Normal respiratory effort without pathologic use of accessory muscles. CARDIOVASCULAR: Heart is regular in rate and rhythm. GI: The abdomen is  soft, nontender, and nondistended. There were no palpable masses. I did not appreciate hepatosplenomegaly. There were normal bowel sounds.  MUSCULOSKELETAL:  Symmetrical muscle tone appreciated in all four extremities.    SKIN: Skin turgor is normal. No pathologic skin lesions appreciated.  NEUROLOGIC:  Motor and sensation appear grossly normal.  Cranial nerves are grossly without defect. PSYCH:  Alert and oriented to person, place and time. Affect is appropriate for situation.  Data  Reviewed I have personally reviewed what is currently available of the patient's imaging, recent labs and medical records.   Labs:  CBC Latest Ref Rng & Units 01/03/2021 11/21/2020 09/07/2018  WBC 4.0 - 10.5 K/uL 6.6 14.7(H) 9.7  Hemoglobin 12.0 - 15.0 g/dL 59.9 77.4 14.2  Hematocrit 36.0 - 46.0 % 41.2 36.8 40.7  Platelets 150 - 400 K/uL 214 204 239   CMP Latest Ref Rng & Units 01/03/2021 09/07/2018 02/12/2018  Glucose 70 - 99 mg/dL 86 98 98  BUN 6 - 20 mg/dL 13 13 6   Creatinine 0.44 - 1.00 mg/dL 3.95 3.20)  Sodium 135 - 145 mmol/L 141 138 136  Potassium 3.5  - 5.1 mmol/L 3.8 3.5 3.8  Chloride 98 - 111 mmol/L 106 103 104  CO2 22 - 32 mmol/L 28 25 25   Calcium 8.9 - 10.3 mg/dL 8.9 8.9 9.1  Total Protein 6.5 - 8.1 g/dL 7.8 7.3 6.7  Total Bilirubin 0.3 - 1.2 mg/dL 0.4 <0.1(L) 0.2(L)  Alkaline Phos 38 - 126 U/L 100 110 69  AST 15 - 41 U/L 24 29 22   ALT 0 - 44 U/L 34 36 24      Imaging: Radiology review:  CLINICAL DATA:  RIGHT upper quadrant abdominal pain, history of gallstones   EXAM: ULTRASOUND ABDOMEN LIMITED RIGHT UPPER QUADRANT   COMPARISON:  09/07/2018   FINDINGS: Gallbladder:   Gallbladder filled with multiple calculi. No gallbladder wall thickening or pericholecystic fluid. Sonographic sign is present.   Common bile duct:   Diameter: 6 mm, borderline dilated. Only a short segment at the porta hepatis is visualized.   Liver:   Normal echogenicity without mass or nodularity. Portal vein is patent on color Doppler imaging with normal direction of blood flow towards the liver.   Other: No RIGHT upper quadrant free fluid.   IMPRESSION: Cholelithiasis with presence of a sonographic Murphy sign, though no definite gallbladder wall thickening or pericholecystic fluid are identified.   Early acute cholecystitis not excluded.   Borderline dilatation of CBD, 6 mm diameter, recommend correlation with LFTs.     Electronically Signed   By: M.D.   On: 01/03/2021 16:42 Within last 24 hrs: No results found.  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 This was discussed thoroughly.  Optimal plan is for robotic cholecystectomy.    We discussed repeating her labs today, and the fact that her common bile duct was slightly larger than normal limits possibility of her having common bile duct stones.  We discussed the potential of additional procedures to clear her common bile duct and I believe she understands this.  Risks and benefits have been discussed with the patient which include but are not limited to anesthesia, bleeding, infection, biliary ductal injury or stenosis, other associated unanticipated injuries affiliated with laparoscopic surgery.  I believe there is the desire to proceed, interpreter utilized as needed.  Questions elicited and answered to satisfaction.  No guarantees ever expressed or implied.   Face-to-face time spent with the patient and accompanying care providers(if present) was 25 minutes, with more than 50% of the time spent counseling, educating,  and coordinating care of the patient.    These notes generated with voice recognition software. I apologize for typographical errors.  Campbell Lerner M.D., FACS 03/27/2021, 10:52 AM

## 2021-03-27 NOTE — Telephone Encounter (Signed)
Patient calls back, she is aware of all dates regarding her surgery, and verbalized understanding.

## 2021-03-27 NOTE — Telephone Encounter (Signed)
Outgoing call is made, left message for patient to call.  Please inform patient of Pre-Admission date/time, COVID Testing date and Surgery date.  Surgery Date: 04/04/21 Preadmission Testing Date: 03/29/21 (phone 8a-1p) Covid Testing Date: Not needed.    Also patient to call at [phone], between 1-3:00pm the day before surgery, to find out what time to arrive for surgery.

## 2021-03-27 NOTE — Patient Instructions (Addendum)
We will have you do lab work done today at [facility], [facility] entrance. Just let them know you are there for lab work.   You have requested to have your gallbladder removed. This will be done at [facility] with Dr. Claudine Mouton.  You will most likely be out of work 1-2 weeks for this surgery. You will return after your post-op appointment with a lifting restriction for approximately 4 more weeks.  You will be able to eat anything you would like to following surgery. But, start by eating a bland diet and advance this as tolerated. The Gallbladder diet is below, please go as closely by this diet as possible prior to surgery to avoid any further attacks.  Please see the (blue)pre-care form that you have been given today. If you have any questions, please call our office.  Laparoscopic Cholecystectomy Laparoscopic cholecystectomy is surgery to remove the gallbladder. The gallbladder is located in the upper right part of the abdomen, behind the liver. It is a storage sac for bile, which is produced in the liver. Bile aids in the digestion and absorption of fats. Cholecystectomy is often done for inflammation of the gallbladder (cholecystitis). This condition is usually caused by a buildup of gallstones (cholelithiasis) in the gallbladder. Gallstones can block the flow of bile, and that can result in inflammation and pain. In severe cases, emergency surgery may be required. If emergency surgery is not required, you will have time to prepare for the procedure. Laparoscopic surgery is an alternative to open surgery. Laparoscopic surgery has a shorter recovery time. Your common bile duct may also need to be examined during the procedure. If stones are found in the common bile duct, they may be removed. LET [facility] CARE PROVIDER KNOW ABOUT: Any allergies you have. All medicines you are taking, including vitamins, herbs, eye drops, creams, and over-the-counter medicines. Previous problems you  or members of your family have had with the use of anesthetics. Any blood disorders you have. Previous surgeries you have had.  Any medical conditions you have. RISKS AND COMPLICATIONS Generally, this is a safe procedure. However, problems may occur, including: Infection. Bleeding. Allergic reactions to medicines. Damage to other structures or organs. A stone remaining in the common bile duct. A bile leak from the cyst duct that is clipped when your gallbladder is removed. The need to convert to open surgery, which requires a larger incision in the abdomen. This may be necessary if your surgeon thinks that it is not safe to continue with a laparoscopic procedure. BEFORE THE PROCEDURE Ask your health care provider about: Changing or stopping your regular medicines. This is especially important if you are taking diabetes medicines or blood thinners. Taking medicines such as aspirin and ibuprofen. These medicines can thin your blood. Do not take these medicines before your procedure if your health care provider instructs you not to. Follow instructions from your health care provider about eating or drinking restrictions. Let your health care provider know if you develop a cold or an infection before surgery. Plan to have someone take you home after the procedure. Ask your health care provider how your surgical site will be marked or identified. You may be given antibiotic medicine to help prevent infection. PROCEDURE To reduce your risk of infection: Your health care team will wash or sanitize their hands. Your skin will be washed with soap. An IV tube may be inserted into one of your veins. You will be given a medicine to make you fall  asleep (general anesthetic). A breathing tube will be placed in your mouth. The surgeon will make several small cuts (incisions) in your abdomen. A thin, lighted tube (laparoscope) that has a tiny camera on the end will be inserted through one of the small  incisions. The camera on the laparoscope will send a picture to a TV screen (monitor) in the operating room. This will give the surgeon a good view inside your abdomen. A gas will be pumped into your abdomen. This will expand your abdomen to give the surgeon more room to perform the surgery. Other tools that are needed for the procedure will be inserted through the other incisions. The gallbladder will be removed through one of the incisions. After your gallbladder has been removed, the incisions will be closed with stitches (sutures), staples, or skin glue. Your incisions may be covered with a bandage (dressing). The procedure may vary among health care providers and hospitals. AFTER THE PROCEDURE Your blood pressure, heart rate, breathing rate, and blood oxygen level will be monitored often until the medicines you were given have worn off. You will be given medicines as needed to control your pain.   This information is not intended to replace advice given to you by your health care provider. Make sure you discuss any questions you have with your health care provider.   Document Released: 07/15/2005 Document Revised: 04/05/2015 Document Reviewed: 02/24/2013 Elsevier Interactive Patient Education 2016 Elsevier Inc.   Low-Fat Diet for Gallbladder Conditions A low-fat diet can be helpful if you have pancreatitis or a gallbladder condition. With these conditions, your pancreas and gallbladder have trouble digesting fats. A healthy eating plan with less fat will help rest your pancreas and gallbladder and reduce your symptoms. WHAT DO I NEED TO KNOW ABOUT THIS DIET? Eat a low-fat diet. Reduce your fat intake to less than 20-30% of your total daily calories. This is less than 50-60 g of fat per day. Remember that you need some fat in your diet. Ask your dietician what your daily goal should be. Choose nonfat and low-fat healthy foods. Look for the words "nonfat," "low fat," or "fat free." As a  guide, look on the label and choose foods with less than 3 g of fat per serving. Eat only one serving. Avoid alcohol. Do not smoke. If you need help quitting, talk with your health care provider. Eat small frequent meals instead of three large heavy meals. WHAT FOODS CAN I EAT? Grains Include healthy grains and starches such as potatoes, wheat bread, fiber-rich cereal, and brown rice. Choose whole grain options whenever possible. In adults, whole grains should account for 45-65% of your daily calories.  Fruits and Vegetables Eat plenty of fruits and vegetables. Fresh fruits and vegetables add fiber to your diet. Meats and Other Protein Sources Eat lean meat such as chicken and pork. Trim any fat off of meat before cooking it. Eggs, fish, and beans are other sources of protein. In adults, these foods should account for 10-35% of your daily calories. Dairy Choose low-fat milk and dairy options. Dairy includes fat and protein, as well as calcium.  Fats and Oils Limit high-fat foods such as fried foods, sweets, baked goods, sugary drinks.  Other Creamy sauces and condiments, such as mayonnaise, can add extra fat. Think about whether or not you need to use them, or use smaller amounts or low fat options. WHAT FOODS ARE NOT RECOMMENDED? High fat foods, such as: [employer]. Ice cream. [place] toast. Sweet rolls. Pizza.  Cheese bread. Foods covered with batter, butter, creamy sauces, or cheese. Fried foods. Sugary drinks and desserts. Foods that cause gas or bloating   This information is not intended to replace advice given to you by your health care provider. Make sure you discuss any questions you have with your health care provider.   Document Released: 07/20/2013 Document Reviewed: 07/20/2013 Elsevier Interactive Patient Education [employer]

## 2021-03-28 ENCOUNTER — Telehealth: Payer: Self-pay | Admitting: Surgery

## 2021-03-28 NOTE — Telephone Encounter (Signed)
Incoming call from patient, she is now informed of all dates regarding her surgery, information given and patient verbalized understanding.

## 2021-03-28 NOTE — Telephone Encounter (Signed)
Outgoing call is made, left message for patient to call me.  Please advise of Pre-Admission date/time, COVID Testing date and Surgery date.  Surgery Date: 04/13/21 Preadmission Testing Date: 04/10/21 (phone 8a-1p) Covid Testing Date: Not needed.     Also patient will  need to call at [phone], between 1-3:00pm the day before surgery, to find out what time to arrive for surgery.

## 2021-03-29 ENCOUNTER — Other Ambulatory Visit: Payer: Self-pay

## 2021-04-10 ENCOUNTER — Encounter
Admission: RE | Admit: 2021-04-10 | Discharge: 2021-04-10 | Disposition: A | Discharge status: Home / Self Care | Payer: Self-pay | Source: Ambulatory Visit | Attending: Surgery | Admitting: Surgery

## 2021-04-10 ENCOUNTER — Other Ambulatory Visit: Payer: Self-pay

## 2021-04-10 NOTE — Patient Instructions (Signed)
Your procedure is scheduled on: 04/13/21 Report to DAY SURGERY DEPARTMENT LOCATED ON 2ND FLOOR [facility] ENTRANCE. To find out your arrival time please call [phone] between 1PM - 3PM on 04/12/21.  Remember: Instructions that are not followed completely may result in serious medical risk, up to and including death, or upon the discretion of your surgeon and anesthesiologist your surgery may need to be rescheduled.     _X__ 1. Do not eat food after midnight the night before your procedure.                 No gum chewing or hard candies.   __X__2.  On the morning of surgery brush your teeth with toothpaste and water, you                 may rinse your mouth with mouthwash if you wish.  Do not swallow any              toothpaste of mouthwash.     _X__ 3.  No Alcohol for 24 hours before or after surgery.   _X__ 4.  Do Not Smoke or use e-cigarettes For 24 Hours Prior to Your Surgery.                 Do not use any chewable tobacco products for at least 6 hours prior to                 surgery.  ____  5.  Bring all medications with you on the day of surgery if instructed.   __X__  6.  Notify your doctor if there is any change in your medical condition      (cold, fever, infections).     Do not wear jewelry, make-up, hairpins, clips or nail polish. Do not wear lotions, powders, or perfumes.  Do not shave body hair 48 hours prior to surgery. Men may shave face and neck. Do not bring valuables to the hospital.    [facility] is not responsible for any belongings or valuables.  Contacts, dentures/partials or body piercings may not be worn into surgery. Bring a case for your contacts, glasses or hearing aids, a denture cup will be supplied. Leave your suitcase in the car. After surgery it may be brought to your room. For patients admitted to the hospital, discharge time is determined by your treatment team.   Patients discharged the day of surgery will not be allowed to drive  home.   Please read over the following fact sheets that you were given:     __X__ Take these medicines the morning of surgery with A SIP OF WATER:    1.   2.   3.   4.  5.  6.  ____ Fleet Enema (as directed)   __ __ Use CHG Soap/SAGE wipes as directed  ____ Use inhalers on the day of surgery  ____ Stop metformin/Janumet/Farxiga 2 days prior to surgery    ____ Take 1/2 of usual insulin dose the night before surgery. No insulin the morning          of surgery.   ____ Stop Blood Thinners Coumadin/Plavix/Xarelto/Pleta/Pradaxa/Eliquis/Effient/Aspirin  on   Or contact your Surgeon, Cardiologist or Medical Doctor regarding  ability to stop your blood thinners  __X__ Stop Anti-inflammatories 7 days before surgery such as Advil, Ibuprofen, Motrin,  BC or Goodies Powder, Naprosyn, Naproxen, Aleve, Aspirin    __X__ Stop all herbal and vitamin supplements, fish oil or vitamin E  until after surgery.    ____ Bring C-Pap to the hospital.    WEAR COMFORTABLE LOOSE FITTING CLOTHING

## 2021-04-11 ENCOUNTER — Encounter (INDEPENDENT_AMBULATORY_CARE_PROVIDER_SITE_OTHER): Payer: Self-pay | Admitting: Primary Care

## 2021-04-11 ENCOUNTER — Ambulatory Visit (INDEPENDENT_AMBULATORY_CARE_PROVIDER_SITE_OTHER): Payer: Self-pay | Admitting: Primary Care

## 2021-04-11 VITALS — BP 123/80 | HR 62 | Temp 97.3°F | Ht 64.0 in | Wt 167.4 lb

## 2021-04-11 DIAGNOSIS — R430 Anosmia: Secondary | ICD-10-CM

## 2021-04-11 NOTE — Progress Notes (Signed)
Established Patient Office Visit  Subjective:  Patient ID: Stacy Wall, female    DOB: [birth year]  Age: 30 y.o. MRN: [id number]  CC:  Chief Complaint  Patient presents with   loss of smell    For over a year. Never had covid     HPI Stacy Wall is a 30 year old female who presents for a ongoing problem of loss of smell for at least a year and a half.  She has never had COVID nor has she been vaccinated.  Past Medical History:  Diagnosis Date   Chronic back pain    Gallstones    Herpes    Medical history non-contributory     Past Surgical History:  Procedure Laterality Date   NO PAST SURGERIES      Family History  Problem Relation Age of Onset   Healthy Mother    Diabetes Maternal Aunt    Cancer Maternal Grandmother        breast, ovarian   Cancer Maternal Grandfather        colon    Social History   Socioeconomic History   Marital status: Married    Spouse name: Engineer, petroleum   Number of children: 2   Years of education: Not on file   Highest education level: 10th grade  Occupational History   Not on file  Tobacco Use   Smoking status: Never   Smokeless tobacco: Never  Vaping Use   Vaping Use: Never used  Substance and Sexual Activity   Alcohol use: No   Drug use: No   Sexual activity: Yes    Birth control/protection: None  Other Topics Concern   Not on file  Social History Narrative   Not on file   Social Determinants of Health   Financial Resource Strain: Not on file  Food Insecurity: Not on file  Transportation Needs: Not on file  Physical Activity: Not on file  Stress: Not on file  Social Connections: Not on file  Intimate Partner Violence: Not on file    Outpatient Medications Prior to Visit  Medication Sig Dispense Refill   medroxyPROGESTERone (DEPO-PROVERA) 150 MG/ML injection Inject 150 mg into the muscle every 3 (three) months.     No facility-administered medications prior to visit.    No Known  Allergies  ROS Review of Systems  HENT:         Loss of smell  All other systems reviewed and are negative.    Objective:   BP 123/80 (BP Location: Right Arm, Patient Position: Sitting, Cuff Size: Normal)   Pulse 62   Temp (!) 97.3 F (36.3 C) (Temporal)   Ht 5\' 4"  (1.626 m)   Wt 167 lb 6.4 oz (75.9 kg)   SpO2 96%   Breastfeeding Yes   BMI 28.73 kg/m  Wt Readings from Last 3 Encounters:  04/11/21 167 lb 6.4 oz (75.9 kg)  04/10/21 160 lb (72.6 kg)  03/27/21 161 lb (73 kg)  Physical Exam General: No apparent distress. Eyes: Extraocular eye movements intact, pupils equal and round. Neck: Supple, trachea midline. Thyroid: No enlargement, mobile without fixation, no tenderness. Cardiovascular: Regular rhythm and rate, no murmur, normal radial pulses. Respiratory: Normal respiratory effort, clear to auscultation. Gastrointestinal: Normal pitch active bowel sounds, nontender abdomen without distention or appreciable hepatomegaly. Neurologic:  deep tendon reflexes +, no tremor,   Musculoskeletal: Normal muscle tone, no tenderness on palpation of tibia, no excessive thoracic kyphosis. Skin: Appropriate warmth, no visible rash. Mental status: Alert, conversant,  speech clear, thought logical, appropriate mood and affect, no hallucinations or delusions evident. Hematologic/lymphatic: No cervical adenopathy, no visible ecchymoses.   Health Maintenance Due  Topic Date Due   PAP-Cervical Cytology Screening  Never done   PAP SMEAR-Modifier  Never done    There are no preventive care reminders to display for this patient.  No results found for: TSH Lab Results  Component Value Date   WBC 8.7 03/27/2021   HGB 14.7 03/27/2021   HCT 42.4 03/27/2021   MCV 88.3 03/27/2021   PLT 253 03/27/2021   Lab Results  Component Value Date   NA 138 03/27/2021   K 4.0 03/27/2021   CO2 23 03/27/2021   GLUCOSE 90 03/27/2021   BUN 15 03/27/2021   CREATININE 0.52 03/27/2021   BILITOT 0.7  03/27/2021   ALKPHOS 87 03/27/2021   AST 20 03/27/2021   ALT 19 03/27/2021   PROT 8.1 03/27/2021   ALBUMIN 4.8 03/27/2021   CALCIUM 9.1 03/27/2021   ANIONGAP 10 03/27/2021   No results found for: CHOL No results found for: HDL No results found for: LDLCALC No results found for: TRIG No results found for: CHOLHDL No results found for: ESPQ3R    Assessment & Plan:  Danelly was seen today for loss of smell.  Diagnoses and all orders for this visit:  Loss of sense of smell  Ambulatory referral to ENT  Follow-up: No follow-ups on file.    Grayce Sessions, NP

## 2021-04-12 ENCOUNTER — Telehealth (INDEPENDENT_AMBULATORY_CARE_PROVIDER_SITE_OTHER): Payer: Self-pay

## 2021-04-12 MED ORDER — INDOCYANINE GREEN 25 MG IV SOLR
2.5000 mg | Freq: Once | INTRAVENOUS | Status: AC
  Administered 2021-04-13: 2.5 mg via INTRAVENOUS
  Filled 2021-04-12: qty 1

## 2021-04-12 NOTE — Telephone Encounter (Signed)
Copied from CRM [id number]. Topic: Referral - Request for Referral >> Apr 12, 2021  4:21 PM Gwenlyn Fudge wrote: Has patient seen PCP for this complaint? Yes.   *If NO, is insurance requiring patient see PCP for this issue before PCP can refer them? Referral for which specialty: ENT Preferred provider/office: N/A Reason for referral: Pt called stating that the ENT specialist that she was referred to does not take the financial assistance she has. She is requesting to have a referral to someone in [facility]. Please advise.

## 2021-04-13 ENCOUNTER — Encounter
Admission: RE | Disposition: A | Discharge status: Home / Self Care | Payer: Self-pay | Source: Home / Self Care | Attending: Surgery

## 2021-04-13 ENCOUNTER — Other Ambulatory Visit: Payer: Self-pay

## 2021-04-13 ENCOUNTER — Ambulatory Visit: Payer: Self-pay | Admitting: Anesthesiology

## 2021-04-13 ENCOUNTER — Encounter: Payer: Self-pay | Admitting: Surgery

## 2021-04-13 ENCOUNTER — Ambulatory Visit
Admission: RE | Admit: 2021-04-13 | Discharge: 2021-04-13 | Disposition: A | Discharge status: Home / Self Care | Payer: Self-pay | Attending: Surgery | Admitting: Surgery

## 2021-04-13 DIAGNOSIS — K8010 Calculus of gallbladder with chronic cholecystitis without obstruction: Secondary | ICD-10-CM

## 2021-04-13 DIAGNOSIS — Z8619 Personal history of other infectious and parasitic diseases: Secondary | ICD-10-CM | POA: Insufficient documentation

## 2021-04-13 DIAGNOSIS — Z793 Long term (current) use of hormonal contraceptives: Secondary | ICD-10-CM | POA: Insufficient documentation

## 2021-04-13 LAB — POCT PREGNANCY, URINE: Preg Test, Ur: NEGATIVE

## 2021-04-13 SURGERY — CHOLECYSTECTOMY, ROBOT-ASSISTED, LAPAROSCOPIC
Anesthesia: General

## 2021-04-13 MED ORDER — ORAL CARE MOUTH RINSE: 15.0000 mL | Freq: Once | OROMUCOSAL | Status: AC

## 2021-04-13 MED ORDER — GABAPENTIN 300 MG PO CAPS
300.0000 mg | ORAL_CAPSULE | ORAL | Status: AC
  Administered 2021-04-13: 300 mg via ORAL

## 2021-04-13 MED ORDER — PROPOFOL 10 MG/ML IV BOLUS
INTRAVENOUS | Status: AC
  Filled 2021-04-13: qty 20

## 2021-04-13 MED ORDER — CELECOXIB 200 MG PO CAPS: 200.0000 mg | ORAL_CAPSULE | ORAL | Status: AC

## 2021-04-13 MED ORDER — ONDANSETRON HCL 4 MG/2ML IJ SOLN
INTRAMUSCULAR | Status: AC
  Filled 2021-04-13: qty 2

## 2021-04-13 MED ORDER — ONDANSETRON HCL 4 MG/2ML IJ SOLN
INTRAMUSCULAR | Status: DC | PRN
  Administered 2021-04-13: 4 mg via INTRAVENOUS

## 2021-04-13 MED ORDER — LACTATED RINGERS IV SOLN: INTRAVENOUS | Status: DC

## 2021-04-13 MED ORDER — CHLORHEXIDINE GLUCONATE CLOTH 2 % EX PADS: 6.0000 | MEDICATED_PAD | Freq: Once | CUTANEOUS | Status: DC

## 2021-04-13 MED ORDER — ONDANSETRON HCL 4 MG/2ML IJ SOLN
INTRAMUSCULAR | Status: AC
  Administered 2021-04-13: 4 mg via INTRAVENOUS
  Filled 2021-04-13: qty 2

## 2021-04-13 MED ORDER — MIDAZOLAM HCL 2 MG/2ML IJ SOLN
INTRAMUSCULAR | Status: AC
  Filled 2021-04-13: qty 2

## 2021-04-13 MED ORDER — DEXMEDETOMIDINE (PRECEDEX) IN NS 20 MCG/5ML (4 MCG/ML) IV SYRINGE
PREFILLED_SYRINGE | INTRAVENOUS | Status: DC | PRN
  Administered 2021-04-13: 8 ug via INTRAVENOUS

## 2021-04-13 MED ORDER — ACETAMINOPHEN 500 MG PO TABS
ORAL_TABLET | ORAL | Status: AC
  Administered 2021-04-13: 1000 mg via ORAL
  Filled 2021-04-13: qty 2

## 2021-04-13 MED ORDER — DEXAMETHASONE SODIUM PHOSPHATE 10 MG/ML IJ SOLN
INTRAMUSCULAR | Status: DC | PRN
  Administered 2021-04-13: 10 mg via INTRAVENOUS

## 2021-04-13 MED ORDER — ACETAMINOPHEN 500 MG PO TABS: 1000.0000 mg | ORAL_TABLET | ORAL | Status: AC

## 2021-04-13 MED ORDER — BUPIVACAINE-EPINEPHRINE (PF) 0.25% -1:200000 IJ SOLN
INTRAMUSCULAR | Status: AC
  Filled 2021-04-13: qty 30

## 2021-04-13 MED ORDER — FENTANYL CITRATE (PF) 100 MCG/2ML IJ SOLN
INTRAMUSCULAR | Status: DC | PRN
  Administered 2021-04-13: 100 ug via INTRAVENOUS

## 2021-04-13 MED ORDER — FAMOTIDINE 20 MG PO TABS: 20.0000 mg | ORAL_TABLET | Freq: Once | ORAL | Status: AC

## 2021-04-13 MED ORDER — EPHEDRINE 5 MG/ML INJ
INTRAVENOUS | Status: AC
  Filled 2021-04-13: qty 5

## 2021-04-13 MED ORDER — BUPIVACAINE LIPOSOME 1.3 % IJ SUSP: 20.0000 mL | Freq: Once | INTRAMUSCULAR | Status: DC

## 2021-04-13 MED ORDER — EPHEDRINE SULFATE 50 MG/ML IJ SOLN
INTRAMUSCULAR | Status: DC | PRN
  Administered 2021-04-13: 10 mg via INTRAVENOUS

## 2021-04-13 MED ORDER — GABAPENTIN 300 MG PO CAPS
ORAL_CAPSULE | ORAL | Status: AC
  Filled 2021-04-13: qty 1

## 2021-04-13 MED ORDER — ROCURONIUM BROMIDE 10 MG/ML (PF) SYRINGE
PREFILLED_SYRINGE | INTRAVENOUS | Status: AC
  Filled 2021-04-13: qty 10

## 2021-04-13 MED ORDER — ROCURONIUM BROMIDE 100 MG/10ML IV SOLN
INTRAVENOUS | Status: DC | PRN
  Administered 2021-04-13: 50 mg via INTRAVENOUS

## 2021-04-13 MED ORDER — PROPOFOL 10 MG/ML IV BOLUS
INTRAVENOUS | Status: DC | PRN
  Administered 2021-04-13: 40 mg via INTRAVENOUS
  Administered 2021-04-13: 160 mg via INTRAVENOUS

## 2021-04-13 MED ORDER — CHLORHEXIDINE GLUCONATE 0.12 % MT SOLN: 15.0000 mL | Freq: Once | OROMUCOSAL | Status: AC

## 2021-04-13 MED ORDER — CEFAZOLIN SODIUM-DEXTROSE 2-4 GM/100ML-% IV SOLN
INTRAVENOUS | Status: AC
  Filled 2021-04-13: qty 100

## 2021-04-13 MED ORDER — FAMOTIDINE 20 MG PO TABS
ORAL_TABLET | ORAL | Status: AC
  Administered 2021-04-13: 20 mg via ORAL
  Filled 2021-04-13: qty 1

## 2021-04-13 MED ORDER — FENTANYL CITRATE (PF) 100 MCG/2ML IJ SOLN
25.0000 ug | INTRAMUSCULAR | Status: DC | PRN
  Administered 2021-04-13: 50 ug via INTRAVENOUS

## 2021-04-13 MED ORDER — LIDOCAINE HCL (CARDIAC) PF 100 MG/5ML IV SOSY
PREFILLED_SYRINGE | INTRAVENOUS | Status: DC | PRN
  Administered 2021-04-13: 50 mg via INTRAVENOUS

## 2021-04-13 MED ORDER — CEFAZOLIN SODIUM-DEXTROSE 2-4 GM/100ML-% IV SOLN
2.0000 g | INTRAVENOUS | Status: AC
  Administered 2021-04-13: 2 g via INTRAVENOUS

## 2021-04-13 MED ORDER — GLYCOPYRROLATE 0.2 MG/ML IJ SOLN
INTRAMUSCULAR | Status: DC | PRN
  Administered 2021-04-13: .2 mg via INTRAVENOUS

## 2021-04-13 MED ORDER — OXYCODONE HCL 5 MG PO TABS
ORAL_TABLET | ORAL | Status: AC
  Administered 2021-04-13: 5 mg via ORAL
  Filled 2021-04-13: qty 1

## 2021-04-13 MED ORDER — OXYCODONE HCL 5 MG PO TABS: 5.0000 mg | ORAL_TABLET | Freq: Once | ORAL | Status: AC | PRN

## 2021-04-13 MED ORDER — MEPERIDINE HCL 25 MG/ML IJ SOLN: 6.2500 mg | INTRAMUSCULAR | Status: DC | PRN

## 2021-04-13 MED ORDER — FENTANYL CITRATE (PF) 100 MCG/2ML IJ SOLN
INTRAMUSCULAR | Status: AC
  Administered 2021-04-13: 50 ug via INTRAVENOUS
  Filled 2021-04-13: qty 2

## 2021-04-13 MED ORDER — OXYCODONE HCL 5 MG/5ML PO SOLN: 5.0000 mg | Freq: Once | ORAL | Status: AC | PRN

## 2021-04-13 MED ORDER — CHLORHEXIDINE GLUCONATE 0.12 % MT SOLN
OROMUCOSAL | Status: AC
  Administered 2021-04-13: 15 mL via OROMUCOSAL
  Filled 2021-04-13: qty 15

## 2021-04-13 MED ORDER — ONDANSETRON HCL 4 MG/2ML IJ SOLN: 4.0000 mg | Freq: Once | INTRAMUSCULAR | Status: AC | PRN

## 2021-04-13 MED ORDER — MIDAZOLAM HCL 2 MG/2ML IJ SOLN
INTRAMUSCULAR | Status: DC | PRN
  Administered 2021-04-13: 2 mg via INTRAVENOUS

## 2021-04-13 MED ORDER — GLYCOPYRROLATE 0.2 MG/ML IJ SOLN
INTRAMUSCULAR | Status: AC
  Filled 2021-04-13: qty 1

## 2021-04-13 MED ORDER — BUPIVACAINE-EPINEPHRINE 0.25% -1:200000 IJ SOLN
INTRAMUSCULAR | Status: DC | PRN
  Administered 2021-04-13: 50 mL

## 2021-04-13 MED ORDER — CELECOXIB 200 MG PO CAPS
ORAL_CAPSULE | ORAL | Status: AC
  Administered 2021-04-13: 200 mg via ORAL
  Filled 2021-04-13: qty 1

## 2021-04-13 MED ORDER — LIDOCAINE HCL (PF) 2 % IJ SOLN
INTRAMUSCULAR | Status: AC
  Filled 2021-04-13: qty 5

## 2021-04-13 MED ORDER — 0.9 % SODIUM CHLORIDE (POUR BTL) OPTIME
TOPICAL | Status: DC | PRN
  Administered 2021-04-13: 100 mL

## 2021-04-13 MED ORDER — HYDROCODONE-ACETAMINOPHEN 5-325 MG PO TABS: 1.0000 | ORAL_TABLET | Freq: Four times a day (QID) | ORAL | 0 refills | Status: DC | PRN

## 2021-04-13 MED ORDER — CHLORHEXIDINE GLUCONATE CLOTH 2 % EX PADS
6.0000 | MEDICATED_PAD | Freq: Once | CUTANEOUS | Status: AC
  Administered 2021-04-13: 6 via TOPICAL

## 2021-04-13 MED ORDER — BUPIVACAINE LIPOSOME 1.3 % IJ SUSP
INTRAMUSCULAR | Status: AC
  Filled 2021-04-13: qty 20

## 2021-04-13 MED ORDER — DEXAMETHASONE SODIUM PHOSPHATE 10 MG/ML IJ SOLN
INTRAMUSCULAR | Status: AC
  Filled 2021-04-13: qty 1

## 2021-04-13 MED ORDER — IBUPROFEN 800 MG PO TABS: 800.0000 mg | ORAL_TABLET | Freq: Three times a day (TID) | ORAL | 0 refills | Status: DC | PRN

## 2021-04-13 MED ORDER — SUGAMMADEX SODIUM 200 MG/2ML IV SOLN
INTRAVENOUS | Status: DC | PRN
  Administered 2021-04-13: 200 mg via INTRAVENOUS

## 2021-04-13 MED ORDER — FENTANYL CITRATE (PF) 100 MCG/2ML IJ SOLN
INTRAMUSCULAR | Status: AC
  Filled 2021-04-13: qty 2

## 2021-04-13 SURGICAL SUPPLY — 40 items
ADH SKN CLS APL DERMABOND .7 (GAUZE/BANDAGES/DRESSINGS) ×1
APL PRP STRL LF DISP 70% ISPRP (MISCELLANEOUS) ×1
BAG SPEC RTRVL LRG 6X4 10 (ENDOMECHANICALS) ×1
CANNULA CAP OBTURATR AIRSEAL 8 (CAP) ×2 IMPLANT
CHLORAPREP W/TINT 26 (MISCELLANEOUS) ×2 IMPLANT
CLIP VESOLOCK LG 6/CT PURPLE (CLIP) ×2 IMPLANT
COVER TIP SHEARS 8 DVNC (MISCELLANEOUS) ×1 IMPLANT
COVER TIP SHEARS 8MM DA VINCI (MISCELLANEOUS) ×1
DEFOGGER SCOPE WARMER CLEARIFY (MISCELLANEOUS) ×2 IMPLANT
DERMABOND ADVANCED (GAUZE/BANDAGES/DRESSINGS) ×1
DERMABOND ADVANCED .7 DNX12 (GAUZE/BANDAGES/DRESSINGS) ×1 IMPLANT
DRAPE ARM DVNC X/XI (DISPOSABLE) ×4 IMPLANT
DRAPE COLUMN DVNC XI (DISPOSABLE) ×1 IMPLANT
DRAPE DA VINCI XI ARM (DISPOSABLE) ×4
DRAPE DA VINCI XI COLUMN (DISPOSABLE) ×1
ELECT CAUTERY BLADE 6.4 (BLADE) ×2 IMPLANT
GAUZE 4X4 16PLY ~~LOC~~+RFID DBL (SPONGE) ×2 IMPLANT
GLOVE SURG ORTHO LTX SZ7.5 (GLOVE) ×4 IMPLANT
GOWN STRL REUS W/ TWL LRG LVL3 (GOWN DISPOSABLE) ×4 IMPLANT
GOWN STRL REUS W/TWL LRG LVL3 (GOWN DISPOSABLE) ×8
KIT PINK PAD W/HEAD ARE REST (MISCELLANEOUS) ×2
KIT PINK PAD W/HEAD ARM REST (MISCELLANEOUS) ×1 IMPLANT
KIT TURNOVER KIT A (KITS) ×2 IMPLANT
LABEL OR SOLS (LABEL) ×2 IMPLANT
MANIFOLD NEPTUNE II (INSTRUMENTS) ×2 IMPLANT
NEEDLE HYPO 22GX1.5 SAFETY (NEEDLE) ×2 IMPLANT
NS IRRIG 500ML POUR BTL (IV SOLUTION) ×2 IMPLANT
PACK LAP CHOLECYSTECTOMY (MISCELLANEOUS) ×2 IMPLANT
PENCIL ELECTRO HAND CTR (MISCELLANEOUS) ×2 IMPLANT
POUCH SPECIMEN RETRIEVAL 10MM (ENDOMECHANICALS) ×2 IMPLANT
SEAL CANN UNIV 5-8 DVNC XI (MISCELLANEOUS) ×3 IMPLANT
SEAL XI 5MM-8MM UNIVERSAL (MISCELLANEOUS) ×3
SET TUBE FILTERED XL AIRSEAL (SET/KITS/TRAYS/PACK) ×2 IMPLANT
SOLUTION ELECTROLUBE (MISCELLANEOUS) ×2 IMPLANT
SUT MNCRL 4-0 (SUTURE) ×2
SUT MNCRL 4-0 27XMFL (SUTURE) ×1
SUT VICRYL 0 AB UR-6 (SUTURE) ×2 IMPLANT
SUTURE MNCRL 4-0 27XMF (SUTURE) ×1 IMPLANT
TROCAR Z-THREAD FIOS 11X100 BL (TROCAR) ×2 IMPLANT
WATER STERILE IRR 500ML POUR (IV SOLUTION) ×2 IMPLANT

## 2021-04-13 NOTE — Transfer of Care (Signed)
Immediate Anesthesia Transfer of Care Note  Patient: Stacy Wall  Procedure(s) Performed: XI ROBOTIC ASSISTED LAPAROSCOPIC CHOLECYSTECTOMY  Patient Location: PACU  Anesthesia Type:General  Level of Consciousness: sedated  Airway & Oxygen Therapy: Patient Spontanous Breathing and Patient connected to face mask oxygen  Post-op Assessment: Report given to RN and Post -op Vital signs reviewed and stable  Post vital signs: Reviewed  Last Vitals:  Vitals Value Taken Time  BP    Temp    Pulse 66 04/13/21 1344  Resp 28 04/13/21 1344  SpO2 100 % 04/13/21 1344  Vitals shown include unvalidated device data.  Last Pain:  Vitals:   04/13/21 1133  TempSrc: Oral  PainSc: 0-No pain         Complications: No notable events documented.

## 2021-04-13 NOTE — Anesthesia Postprocedure Evaluation (Signed)
Anesthesia Post Note  Patient: Stacy Wall  Procedure(s) Performed: XI ROBOTIC ASSISTED LAPAROSCOPIC CHOLECYSTECTOMY  Patient location during evaluation: PACU Anesthesia Type: General Level of consciousness: awake and alert Pain management: pain level controlled Vital Signs Assessment: post-procedure vital signs reviewed and stable Respiratory status: spontaneous breathing, nonlabored ventilation, respiratory function stable and patient connected to nasal cannula oxygen Cardiovascular status: blood pressure returned to baseline and stable Postop Assessment: no apparent nausea or vomiting Anesthetic complications: no   No notable events documented.   Last Vitals:  Vitals:   04/13/21 1133 04/13/21 1344  BP: 98/66 (!) 148/91  Pulse: 77 66  Resp: 16 (!) 28  Temp: (!) 36.4 C 36.4 C  SpO2: 100% 100%    Last Pain:  Vitals:   04/13/21 1344  TempSrc:   PainSc: Asleep                 Irie Fiorello Michae Kava

## 2021-04-13 NOTE — Anesthesia Preprocedure Evaluation (Addendum)
Anesthesia Evaluation  Patient identified by MRN, date of birth, ID band Patient awake    Reviewed: Allergy & Precautions, NPO status , Patient's Chart, lab work & pertinent test results, reviewed documented beta blocker date and time   Airway Mallampati: I  TM Distance: >3 FB     Dental no notable dental hx.    Pulmonary    Pulmonary exam normal        Cardiovascular Normal cardiovascular examI     Neuro/Psych    GI/Hepatic   Endo/Other    Renal/GU      Musculoskeletal   Abdominal Normal abdominal exam  (+)   Peds  Hematology   Anesthesia Other Findings   Reproductive/Obstetrics                             Anesthesia Physical Anesthesia Plan  ASA: 1  Anesthesia Plan: General   Post-op Pain Management:    Induction: Intravenous  PONV Risk Score and Plan: 3 and Ondansetron, Aprepitant and Metaclopromide  Airway Management Planned: Oral ETT  Additional Equipment:   Intra-op Plan:   Post-operative Plan: Extubation in OR  Informed Consent: I have reviewed the patients History and Physical, chart, labs and discussed the procedure including the risks, benefits and alternatives for the proposed anesthesia with the patient or authorized representative who has indicated his/her understanding and acceptance.       Plan Discussed with: CRNA  Anesthesia Plan Comments:         Anesthesia Quick Evaluation

## 2021-04-13 NOTE — Interval H&P Note (Signed)
History and Physical Interval Note:  04/13/2021 12:09 PM  Stacy Wall  has presented today for surgery, with the diagnosis of chronic calculous cholecystitis.  The various methods of treatment have been discussed with the patient and family. After consideration of risks, benefits and other options for treatment, the patient has consented to  Procedure(s): XI ROBOTIC ASSISTED LAPAROSCOPIC CHOLECYSTECTOMY (N/A) as a surgical intervention.  The patient's history has been reviewed, patient examined, no change in status, stable for surgery.  I have reviewed the patient's chart and labs.  Questions were answered to the patient's satisfaction.     Campbell Lerner

## 2021-04-13 NOTE — Anesthesia Procedure Notes (Signed)
Procedure Name: Intubation Date/Time: 04/13/2021 12:41 PM Performed by: Rolla Plate, CRNA Pre-anesthesia Checklist: Patient identified, Patient being monitored, Timeout performed, Emergency Drugs available and Suction available Patient Re-evaluated:Patient Re-evaluated prior to induction Oxygen Delivery Method: Circle system utilized Preoxygenation: Pre-oxygenation with 100% oxygen Induction Type: IV induction Ventilation: Mask ventilation without difficulty Laryngoscope Size: Mac and 3 Grade View: Grade I Tube type: Oral Tube size: 7.0 mm Number of attempts: 1 Airway Equipment and Method: Stylet Placement Confirmation: ETT inserted through vocal cords under direct vision, positive ETCO2 and breath sounds checked- equal and bilateral Secured at: 21 cm Tube secured with: Tape Dental Injury: Teeth and Oropharynx as per pre-operative assessment

## 2021-04-13 NOTE — Discharge Instructions (Addendum)
AMBULATORY SURGERY  DISCHARGE INSTRUCTIONS   The drugs that you were given will stay in your system until tomorrow so for the next 24 hours you should not:  Drive an automobile Make any legal decisions Drink any alcoholic beverage   You may resume regular meals tomorrow.  Today it is better to start with liquids and gradually work up to solid foods.  You may eat anything you prefer, but it is better to start with liquids, then soup and crackers, and gradually work up to solid foods.   Please notify your doctor immediately if you have any unusual bleeding, trouble breathing, redness and pain at the surgery site, drainage, fever, or pain not relieved by medication.    Additional Instructions: Alternate tylenol 500mg  every 8 hours and ibuprofen 800mg  every 8 hours.  (Start with ibuprofen after home and has eaten something.  Leave green band on for 4 days     Please contact your physician with any problems or Same Day Surgery at [phone], Monday through Friday 6 am to 4 pm, or [facility] number at [phone].

## 2021-04-13 NOTE — Op Note (Signed)
Robotic cholecystectomy with Indocyamine Green Ductal Imaging.   Pre-operative Diagnosis: Chronic calculus cholecystitis  Post-operative Diagnosis:  Same.  Procedure: Robotic assisted laparoscopic cholecystectomy with Indocyamine Green Ductal Imaging.   Surgeon: Campbell Lerner, M.D., FACS  Anesthesia: General. with endotracheal tube  Findings: As expected, multiple gallstones in gallbladder, with normal caliber cystic duct.  Estimated Blood Loss: 5 mL         Drains: None         Specimens: Gallbladder           Complications: none  Procedure Details  The patient was seen again in the Holding Room.  2.5 mg dose of ICG was administered intravenously.   The benefits, complications, treatment options, risks and expected outcomes were again reviewed with the patient. The likelihood of improving the patient's symptoms with return to their baseline status is good.  The patient and/or family concurred with the proposed plan, giving informed consent, again alternatives reviewed.  The patient was taken to Operating Room, identified, and the procedure verified as robotic assisted laparoscopic cholecystectomy.  Prior to the induction of general anesthesia, antibiotic prophylaxis was administered. VTE prophylaxis was in place. General endotracheal anesthesia was then administered and tolerated well. The patient was positioned in the supine position.  After the induction, the abdomen was prepped with Chloraprep and draped in the sterile fashion.  A Time Out was held and the above information confirmed.  Right infra-umbilical local infiltration with quarter percent Marcaine with epinephrine is utilized.  Made a 12 mm incision on the right periumbilical site, I advanced an optical 35mm port under direct visualization into the peritoneal cavity.  Once the peritoneum was penetrated, insufflation was initiated.  The trocar was then advanced into the abdominal cavity under direct  visualization. Pneumoperitoneum was then continued with Air seal utilizing CO2 at 15 mmHg or less and tolerated well without any adverse changes in the patient's vital signs.  Two 8.5-mm ports were placed in the left lower quadrant and laterally, and one to the right lower quadrant, all under direct vision. All skin incisions  were infiltrated with a local anesthetic agent before making the incision and placing the trocars.  The patient was positioned  in reverse Trendelenburg, tilted the patient's left side down.  Da Vinci XI robot was then positioned on to the patient's left side, and docked.  The gallbladder was identified, the fundus grasped via the arm 4 Prograsp and retracted cephalad. Adhesions were lysed with scissors and cautery.  The infundibulum was identified grasped and retracted laterally, exposing the peritoneum overlying the triangle of Calot. This was then opened and dissected using cautery & scissors. An extended critical view of the cystic duct and cystic artery was obtained, aided by the ICG via FireFly which enabled ready visualization of the ductal anatomy.    The cystic duct was clearly identified and dissected to isolation.   Artery well isolated and clipped, and the cystic duct was triple clipped and divided with scissors, as close to the gallbladder neck as feasible, thus leaving two on the remaining stump.  The specimen side of the artery is sealed with bipolar and divided with monopolar scissors.   The gallbladder was taken from the gallbladder fossa in a retrograde fashion with the electrocautery. The gallbladder was removed and placed in an Endocatch bag.  The liver bed is inspected. Hemostasis was confirmed.  The robot was undocked and moved away from the operative field. No irrigation was utilized.   The gallbladder and Endocatch  sac were then removed through the infraumbilical port site.   Inspection of the right upper quadrant was performed. No bleeding, bile duct  injury or leak, or bowel injury was noted. The infra-umbilical port site fascia was closed with interrumpted 0 Vicryl sutures using PMI/cone under direct visualization. Pneumoperitoneum was released and ports removed.  4-0 subcuticular Monocryl was used to close the skin. Dermabond was  applied.  The patient was then extubated and brought to the recovery room in stable condition. Sponge, lap, and needle counts were correct at closure and at the conclusion of the case.               Campbell Lerner, M.D., [facility] 04/13/2021 1:41 PM

## 2021-04-14 NOTE — Anesthesia Postprocedure Evaluation (Signed)
Anesthesia Post Note  Patient: Stacy Wall  Procedure(s) Performed: XI ROBOTIC ASSISTED LAPAROSCOPIC CHOLECYSTECTOMY  Patient location during evaluation: PACU Anesthesia Type: General Level of consciousness: awake and alert Pain management: pain level controlled Vital Signs Assessment: post-procedure vital signs reviewed and stable Respiratory status: spontaneous breathing, nonlabored ventilation, respiratory function stable and patient connected to nasal cannula oxygen Cardiovascular status: blood pressure returned to baseline and stable Postop Assessment: no apparent nausea or vomiting Anesthetic complications: no   No notable events documented.   Last Vitals:  Vitals:   04/13/21 1500 04/13/21 1544  BP: 116/83   Pulse: (!) 59 60  Resp: (!) 21   Temp: 36.4 C   SpO2: 99%     Last Pain:  Vitals:   04/13/21 1544  TempSrc:   PainSc: 4                  Valicia Rief

## 2021-04-16 LAB — SURGICAL PATHOLOGY

## 2021-04-17 ENCOUNTER — Telehealth (INDEPENDENT_AMBULATORY_CARE_PROVIDER_SITE_OTHER): Payer: Self-pay

## 2021-04-17 NOTE — Telephone Encounter (Signed)
Patient called to follow up on referral. Explained that there is not an ENT in network that would accept her financial assistance. She would be required to pay out of pocket $100 for visit at [facility]. Maryjean Morn, CMA

## 2021-04-18 ENCOUNTER — Encounter: Payer: Self-pay | Admitting: Physician Assistant

## 2021-04-18 ENCOUNTER — Other Ambulatory Visit: Payer: Self-pay

## 2021-04-18 ENCOUNTER — Ambulatory Visit (INDEPENDENT_AMBULATORY_CARE_PROVIDER_SITE_OTHER): Payer: Self-pay | Admitting: Physician Assistant

## 2021-04-18 VITALS — BP 116/80 | HR 87 | Temp 98.0°F | Ht 65.0 in | Wt 160.4 lb

## 2021-04-18 DIAGNOSIS — Z09 Encounter for follow-up examination after completed treatment for conditions other than malignant neoplasm: Secondary | ICD-10-CM

## 2021-04-18 DIAGNOSIS — K8010 Calculus of gallbladder with chronic cholecystitis without obstruction: Secondary | ICD-10-CM

## 2021-04-18 NOTE — Progress Notes (Signed)
[facility] POST-OP OFFICE VISIT  04/18/2021  HPI: Stacy Wall is a 30 y.o. female 5 days s/p robotic assisted laparoscopic cholecystectomy for [facility] with Dr Claudine Mouton.   Two issues today. First, she has noticed swelling around her infra-umbilical incision since the day of surgery. She notes that this seems to be worse with standing. No fever, chills, or erythema. This is sore. Hydrocodone and Ibuprofen are helping some.   Additionally, she reports exacerbation in what sounds like her chronic constipation since surgery. She has only tried Miralax once since her surgery.   She is tolerating PO Ambulating well No issues with incisions themselves  Vital signs: BP 116/80   Pulse 87   Temp 98 F (36.7 C) (Oral)   Ht 5\' 5"  (1.651 m)   Wt 160 lb 6.4 oz (72.8 kg)   SpO2 97%   BMI 26.69 kg/m    Physical Exam: Constitutional: Well appearing female, NAD Abdomen: Soft, she does have an area of swelling superior to her infra-umbilical incision, this is soft, tender, no overlaying skin changes, this is not exacerbated with valsalva maneuvers, non-distended, no rebound/guarding Skin: Laparoscopic incisions are healing well, dermabond in place, no erythema or drainage   Assessment/Plan: This is a 30 y.o. female 5 days s/p robotic assisted laparoscopic cholecystectomy for [facility]   - I do believe her swelling superior to her infra-umbilical incision is most likely attributable to seroma. I have low suspicion for a hernia presently and this does not appear to be an abscess/wound infection. Reassurance given.   - Continue pain control prn  - Encouraged Miralax BID for constipation especially while needing narcotic pain medications. Titrate as needed  - Reviewed lifting restrictions; okay to wear abdominal binder for comfort as needed  - Reviewed wound care   - Reviewed surgical pathology: [facility], negative for malignancy   - She has follow up on 09/27; encouraged her to keep this  appointment with me for re-check   -- 10/27, PA-C [facility] 04/18/2021, 9:56 AM [phone] M-F: 7am - 4pm

## 2021-04-18 NOTE — Patient Instructions (Addendum)
No lifting, pushing or pulling anything over 10 pound until 05/11/2022. You may take Miralax twice daily for constipation.  If you have any concerns or questions, please feel free to call our office. See follow up appointment below.   Minimally Invasive Cholecystectomy, Care After This sheet gives you information about how to care for yourself after your procedure. Your doctor may also give you more specific instructions. If you have problems or questions, contact your doctor. What can I expect after the procedure? After the procedure, it is common: To have pain at the areas of surgery. You will be given medicines for pain. To vomit or feel like you may vomit. To feel fullness in the belly (bloating) or to have pain in the shoulder. This comes from the gas that was used during the surgery. Follow these instructions at home: Medicines Take over-the-counter and prescription medicines only as told by your doctor. If you were prescribed an antibiotic medicine, take it as told by your doctor. Do not stop using the antibiotic even if you start to feel better. Ask your doctor if the medicine prescribed to you: Requires you to avoid driving or using machinery. Can cause trouble pooping (constipation). You may need to take these actions to prevent or treat trouble pooping: Drink enough fluid to keep your pee (urine) pale yellow. Take over-the-counter or prescription medicines. Eat foods that are high in fiber. These include beans, whole grains, and fresh fruits and vegetables. Limit foods that are high in fat and sugar. These include fried or sweet foods. Incision care  Follow instructions from your doctor about how to take care of your cuts from surgery (incisions). Make sure you: Wash your hands with soap and water for at least 20 seconds before and after you change your bandage (dressing). If you cannot use soap and water, use hand sanitizer. Change your bandage as told by your doctor. Leave  stitches (sutures), skin glue, or skin tape (adhesive) strips in place. They may need to stay in place for 2 weeks or longer. If tape strips get loose and curl up, you may trim the loose edges. Do not remove tape strips completely unless your doctor says it is okay. Do not take baths, swim, or use a hot tub until your doctor approves. Ask your doctor if you may take showers. You may only be allowed to take sponge baths. Check your surgery area every day for signs of infection. Check for: More redness, swelling, or pain. Fluid or blood. Warmth. Pus or a bad smell. Activity Rest as told by your doctor. Do not sit for a long time without moving. Get up to take short walks every 1-2 hours. This is important. Ask for help if you feel weak or unsteady. Do not lift anything that is heavier than 10 lb (4.5 kg), or the limit that you are told, until your doctor says that it is safe. Do not play contact sports until your doctor says it is okay. Do not return to work or school until your doctor says it is okay. Return to your normal activities as told by your doctor. Ask your doctor what activities are safe for you. General instructions If you were given a medicine to help you relax (sedative) during your procedure, it can affect you for many hours. Do not drive or use machinery until your doctor says that it is safe. Keep all follow-up visits as told by your doctor. This is important. Contact a doctor if: You get a rash.  You have more redness, swelling, or pain around your cuts from surgery. You have fluid or blood coming from your cuts from surgery. Your cuts from surgery feel warm to the touch. You have pus or a bad smell coming from your cuts from surgery. You have a fever. One or more of your cuts from surgery breaks open. Get help right away if: You have trouble breathing. You have chest pain. You have pain that is getting worse in your shoulders. You faint or feel dizzy when you stand. You  have very bad pain in your belly (abdomen). You feel like you may vomit or you vomit, and this lasts for more than one day. You have leg pain. Summary After your surgery, it is common to have pain at the areas of surgery. You may also have vomiting or fullness in the belly. Follow your doctor's instructions about medicine, activity restrictions, and caring for your surgery areas. Do not do activities that require a lot of effort. Contact a doctor if you have a fever or other signs of infection, such as more redness, swelling, or pain around the cuts from surgery. Get help right away if you have chest pain, increasing pain in the shoulders, or trouble breathing. This information is not intended to replace advice given to you by your health care provider. Make sure you discuss any questions you have with your health care provider.  Gallbladder Eating Plan If you have a gallbladder condition, you may have trouble digesting fats. Eating a low-fat diet can help reduce your symptoms, and may be helpful before and after having surgery to remove your gallbladder (cholecystectomy). Your health care provider may recommend that you work with a diet and nutrition specialist (dietitian) to help you reduce the amount of fat in your diet. What are tips for following this plan? General guidelines Limit your fat intake to less than 30% of your total daily calories. If you eat around 1,800 calories each day, this is less than 60 grams (g) of fat per day. Fat is an important part of a healthy diet. Eating a low-fat diet can make it hard to maintain a healthy body weight. Ask your dietitian how much fat, calories, and other nutrients you need each day. Eat small, frequent meals throughout the day instead of three large meals. Drink at least 8-10 cups of fluid a day. Drink enough fluid to keep your urine clear or pale yellow. Limit alcohol intake to no more than 1 drink a day for nonpregnant women and 2 drinks a day  for men. One drink equals 12 oz of beer, 5 oz of wine, or 1 oz of hard liquor. Reading food labels  Check Nutrition Facts on food labels for the amount of fat per serving. Choose foods with less than 3 grams of fat per serving. Shopping Choose nonfat and low-fat healthy foods. Look for the words "nonfat," "low fat," or "fat free." Avoid buying processed or prepackaged foods. Cooking Cook using low-fat methods, such as baking, broiling, grilling, or boiling. Cook with small amounts of healthy fats, such as olive oil, grapeseed oil, canola oil, or sunflower oil. What foods are recommended? All fresh, frozen, or canned fruits and vegetables. Whole grains. Low-fat or non-fat (skim) milk and yogurt. Lean meat, skinless poultry, fish, eggs, and beans. Low-fat protein supplement powders or drinks. Spices and herbs. What foods are not recommended? High-fat foods. These include baked goods, fast food, fatty cuts of meat, ice cream, french toast, sweet rolls, pizza, cheese  bread, foods covered with butter, creamy sauces, or cheese. Fried foods. These include french fries, tempura, battered fish, breaded chicken, fried breads, and sweets. Foods with strong odors. Foods that cause bloating and gas. Summary A low-fat diet can be helpful if you have a gallbladder condition, or before and after gallbladder surgery. Limit your fat intake to less than 30% of your total daily calories. This is about 60 g of fat if you eat 1,800 calories each day. Eat small, frequent meals throughout the day instead of three large meals. This information is not intended to replace advice given to you by your health care provider. Make sure you discuss any questions you have with your health care provider. Document Revised: 03/02/2020 Document Reviewed: 03/02/2020 Elsevier Patient Education  2022 [employer]  Seroma A seroma is a collection of fluid on the body that looks like swelling or a mass. Seromas form where  tissue has been injured or cut. Seromas vary in size. Some are small and painless. Others may become large and cause pain or discomfort. Many seromas go away on their own as the fluid is naturally absorbed by the body, and some seromas need to be drained. What are the causes? Seromas form as the result of damage to tissue or the removal of tissue. This tissue damage may happen during surgery or because of an injury or trauma. When tissue is disrupted or removed, empty space is created. The body's natural defense system (immune system) causes fluid to enter the empty space and form a seroma. What are the signs or symptoms? Symptoms of this condition include: Swelling at the site of a surgical incision or an injury. Drainage of clear fluid at the surgery or injury site. Discomfort or pain. How is this diagnosed? This condition is diagnosed based on: Your symptoms. Your medical history. A physical exam. During the exam, your health care provider will press on the seroma. You may also have tests, including: Blood tests. Imaging tests, such as an ultrasound or a CT scan. How is this treated? Some seromas go away on their own. Your health care provider may monitor you to make sure the seroma does not cause any problems. If your seroma does not go away on its own, treatment may include: Using a needle to drain the fluid from the seroma (needle aspiration). Inserting a small, thin tube (catheter) to drain the fluid. Applying a bandage (dressing), such as an elastic bandage or binder. Taking antibiotic medicines, if the seroma becomes infected. In rare cases, surgery may be done to remove the seroma and repair the area. Follow these instructions at home:  If you were prescribed an antibiotic medicine, take it as told by your health care provider. Do not stop using the antibiotic even if you start to feel better. Take over-the-counter and prescription medicines only as told by your health care  provider. Return to your normal activities as told by your health care provider. Ask your health care provider what activities are safe for you. Check your seroma every day for signs of infection. Check for: Redness or pain. More swelling. More fluid. Warmth. Pus or a bad smell. Keep all follow-up visits as told by your health care provider. This is important. Contact a health care provider if: You have a fever. You have redness or pain at the site of the seroma. Your seroma is more swollen or is getting bigger. You have more fluid coming from the seroma. Your seroma feels warm to the touch. You  have pus or a bad smell coming from the seroma. Get help right away if: You have a fever along with severe pain, redness at the site, or chills. You feel confused or have trouble staying awake. You feel like your heart is beating very fast. You feel short of breath or are breathing rapidly. You have cool, clammy, or sweaty skin. Summary Seromas can form as a result of injury or surgery on tissue. Seromas can cause swelling, drainage of clear fluid, and discomfort or pain at the site of the tissue injury. Some seromas go away on their own. Other seromas may need to be drained. Check your seroma every day for signs of infection, such as pain, more swelling, more fluid, warmth, or pus or a bad smell. This information is not intended to replace advice given to you by your health care provider. Make sure you discuss any questions you have with your health care provider. Document Revised: 06/07/2019 Document Reviewed: 06/07/2019 Elsevier Patient Education  2022 [employer].

## 2021-04-24 ENCOUNTER — Encounter: Payer: Self-pay | Admitting: Physician Assistant

## 2021-04-24 ENCOUNTER — Ambulatory Visit (INDEPENDENT_AMBULATORY_CARE_PROVIDER_SITE_OTHER): Payer: Self-pay | Admitting: Physician Assistant

## 2021-04-24 ENCOUNTER — Other Ambulatory Visit: Payer: Self-pay

## 2021-04-24 VITALS — BP 119/76 | HR 76 | Temp 98.0°F | Ht 65.0 in | Wt 161.0 lb

## 2021-04-24 DIAGNOSIS — Z09 Encounter for follow-up examination after completed treatment for conditions other than malignant neoplasm: Secondary | ICD-10-CM

## 2021-04-24 DIAGNOSIS — K8010 Calculus of gallbladder with chronic cholecystitis without obstruction: Secondary | ICD-10-CM

## 2021-04-24 NOTE — Progress Notes (Signed)
[facility] POST-OP OFFICE VISIT  04/24/2021  HPI: Stacy Wall is a 30 y.o. female 11 days s/p robotic assisted laparoscopic cholecystectomy for  [facility]  She continues to notice swelling superior to her camera port site just to the right of the umbilicus. This has been stable since her appointment on 09/21. Continues to be tender. She is using an abdominal binder for support She denied any fever, chills, nausea, emesis The incisions themselves are healing well without erythema or drainage  No other new issues   Vital signs: BP 119/76   Pulse 76   Temp 98 F (36.7 C)   Ht 5\' 5"  (1.651 m)   Wt 161 lb (73 kg)   SpO2 96%   Breastfeeding Yes   BMI 26.79 kg/m    Physical Exam: Constitutional: Well appearing female, NAD Abdomen: Soft, she does have an area of swelling superior to her infra-umbilical incision just to the right of her umbilicus, this is stable compared to examination six days ago, no overlaying skin changes, this is not exacerbated with valsalva maneuvers, non-distended, no rebound/guarding Skin: Laparoscopic incisions are healing well, dermabond in place, no erythema or drainage   Assessment/Plan: This is a 30 y.o. female 11 days s/p robotic assisted laparoscopic cholecystectomy for [facility]   - I still feel this area of swelling most likely represents a seroma. She is without any infectious symptoms (ie: erythema, fever, drainage). Additionally, if she had a untreated post-operative infection over the last 6 days I would expect this to be getting worse. She is also without any palpable abdominal wall defects nor worsening with valsalva maneuvers which lowers my suspicion for hernia. I did spend some time providing her reassurance. I did offer potential for CT Abdomen/Pelvis to provide peace of mind, but we will hold off for now. She will follow up in 2-3 weeks to ensure she is getting better. If this fails to improve/resolve then we will consider imaging at that  time.   -- [facility], PA-C [facility] 04/24/2021, 4:13 PM [phone] M-F: 7am - 4pm

## 2021-04-24 NOTE — Patient Instructions (Signed)
Keep wearing the belly binder. The body will reabsorb the seroma but it may take some time for it to do this.  You may follow up in 2-3 weeks for a recheck.   GENERAL POST-OPERATIVE PATIENT INSTRUCTIONS   WOUND CARE INSTRUCTIONS: If the wound becomes bright red and painful or starts to drain infected material that is not clear, please contact your physician immediately.  If the wound is mildly pink and has a thick firm ridge underneath it, this is normal, and is referred to as a healing ridge.  This will resolve over the next 4-6 weeks. BATHING: You may shower if you have been informed of this by your surgeon. However, Please do not submerge in a tub, hot tub, or pool until incisions are completely sealed or have been told by your surgeon that you may do so.  DIET:  You may eat any foods that you can tolerate.  It is a good idea to eat a high fiber diet and take in plenty of fluids to prevent constipation.  If you do become constipated you may want to take a mild laxative or take ducolax tablets on a daily basis until your bowel habits are regular.  Constipation can be very uncomfortable, along with straining, after recent surgery.  ACTIVITY:  You are encouraged to walk and engage in light activity for the next two weeks.  You should not lift more than 20 pounds for 6 weeks after surgery as it could put you at increased risk for complications.  Twenty pounds is roughly equivalent to a plastic bag of groceries. At that time- Listen to your body when lifting, if you have pain when lifting, stop and then try again in a few days. Soreness after doing exercises or activities of daily living is normal as you get back in to your normal routine.  MEDICATIONS:  Try to take narcotic medications and anti-inflammatory medications, such as tylenol, ibuprofen, naprosyn, etc., with food.  This will minimize stomach upset from the medication.  Should you develop nausea and vomiting from the pain medication, or develop  a rash, please discontinue the medication and contact your physician.  You should not drive, make important decisions, or operate machinery when taking narcotic pain medication.  SUNBLOCK Use sun block to incision area over the next year if this area will be exposed to sun. This helps decrease scarring and will allow you avoid a permanent darkened area over your incision.  QUESTIONS:  Please feel free to call our office if you have any questions, and we will be glad to assist you.

## 2021-04-26 ENCOUNTER — Telehealth: Payer: Self-pay | Admitting: Surgery

## 2021-04-26 NOTE — Telephone Encounter (Signed)
Patient had surgery on 04/13/21 lap chole with Dr. Claudine Mouton.  She states that she has been constipated, having trouble getting her stool to come out.  She states that when some of it did come out that is was very bloody. The only thing she has tried using is Ex-Lax.  Wants to know what else she can do.  Please call her. Thank you.

## 2021-05-10 ENCOUNTER — Encounter: Payer: Self-pay | Admitting: Physician Assistant

## 2021-05-23 ENCOUNTER — Encounter: Payer: Self-pay | Admitting: Physician Assistant

## 2021-06-05 ENCOUNTER — Encounter: Payer: Self-pay | Admitting: Physician Assistant

## 2021-06-19 ENCOUNTER — Encounter: Payer: Self-pay | Admitting: Surgery

## 2021-10-09 IMAGING — US US ABDOMEN LIMITED
1 series · 15 of 25 positions shown · non-contrast
Comparison: 09/07/2018

CLINICAL DATA: RIGHT upper quadrant abdominal pain, history of
gallstones

EXAM:
ULTRASOUND ABDOMEN LIMITED RIGHT UPPER QUADRANT

[Series 1: us abdomen limited ruq mc & wl · 15 of 61 slices shown]
[im 1/61]
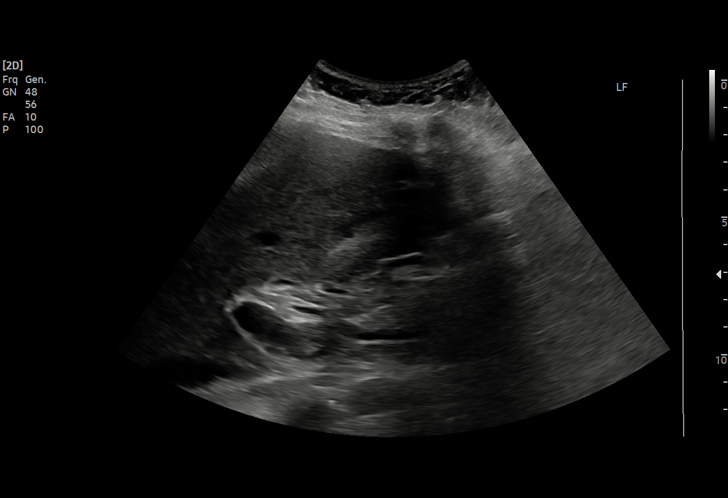
[im 6/61]
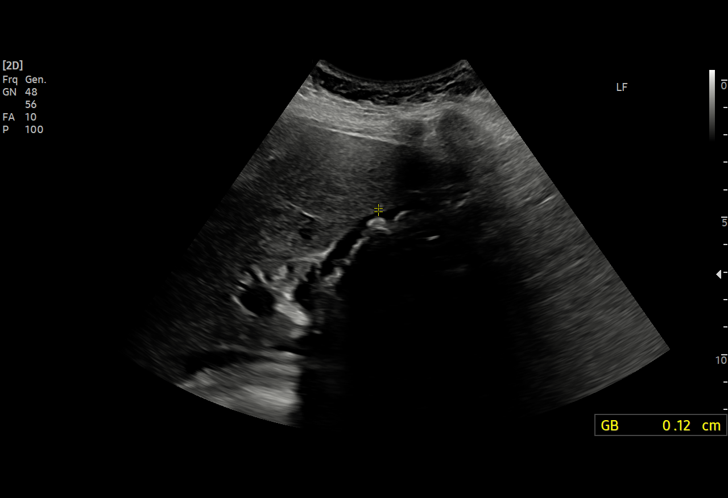
[im 11/61]
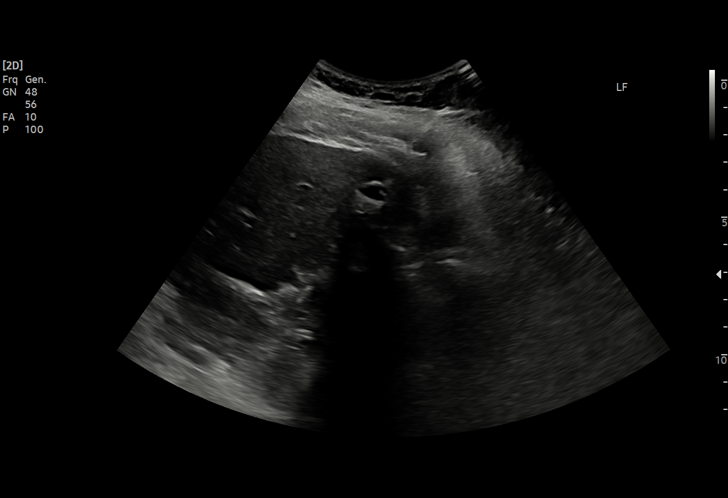
[im 13/61]
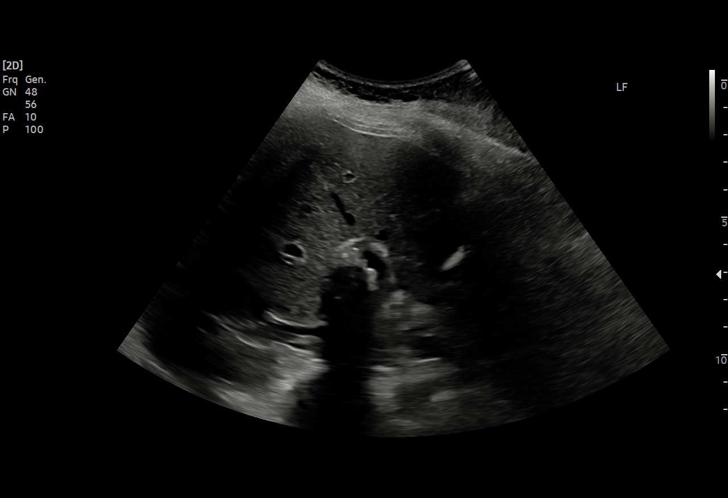
[im 18/61]
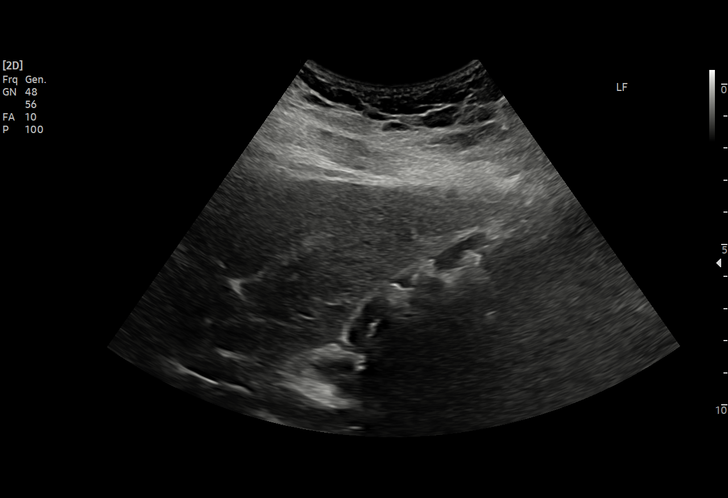
[im 23/61]
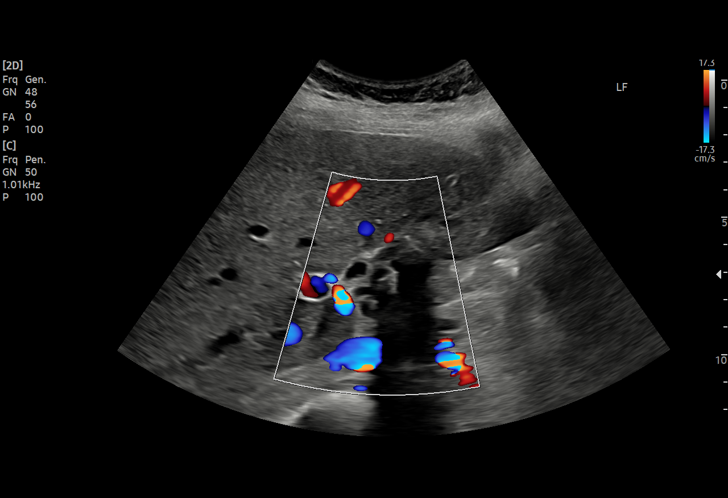
[im 26/61]
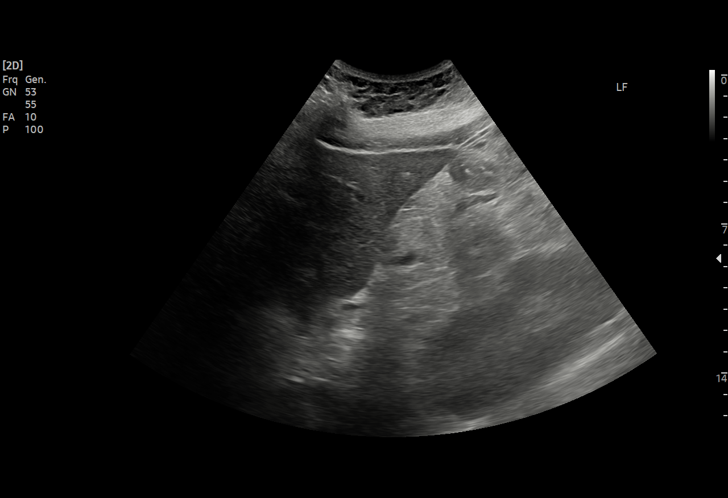
[im 31/61]
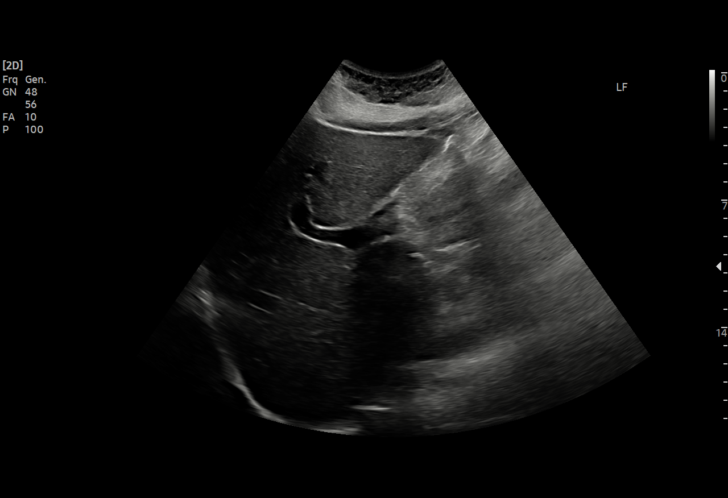
[im 36/61]
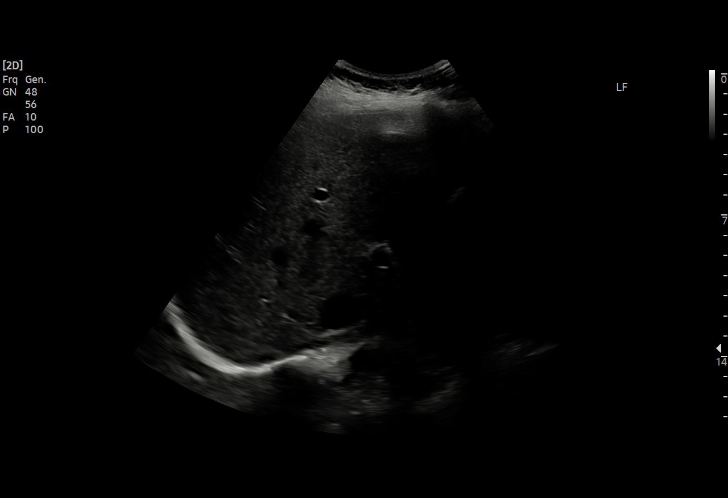
[im 38/61]
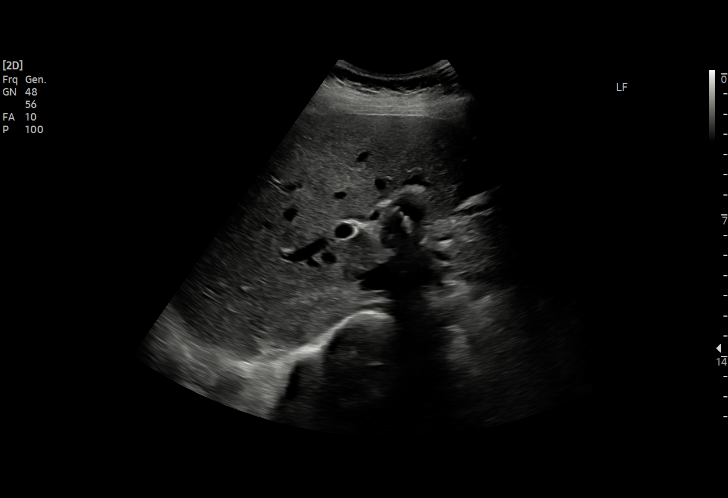
[im 43/61]
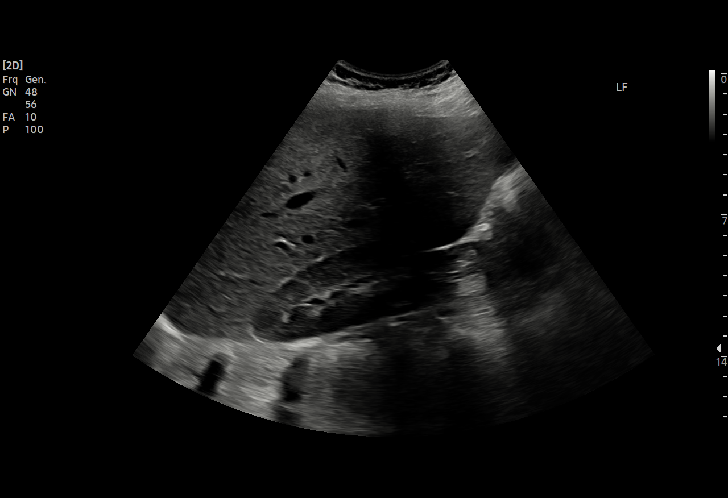
[im 48/61]
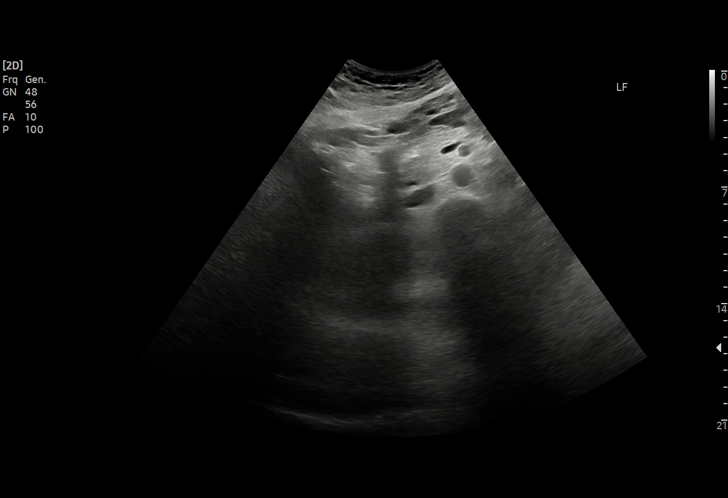
[im 51/61]
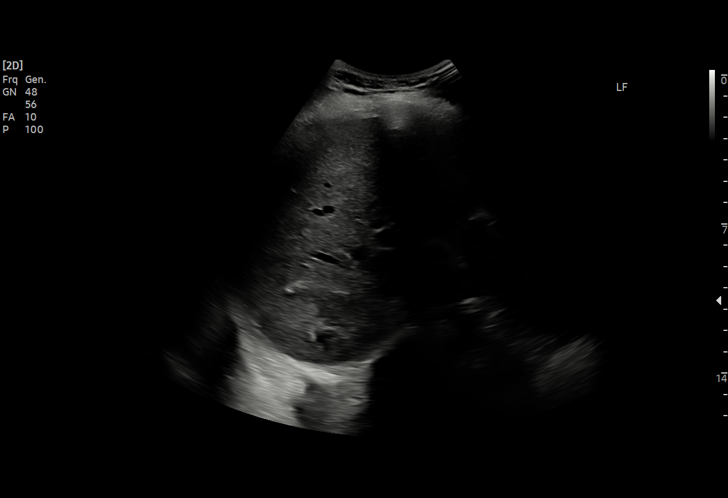
[im 56/61]
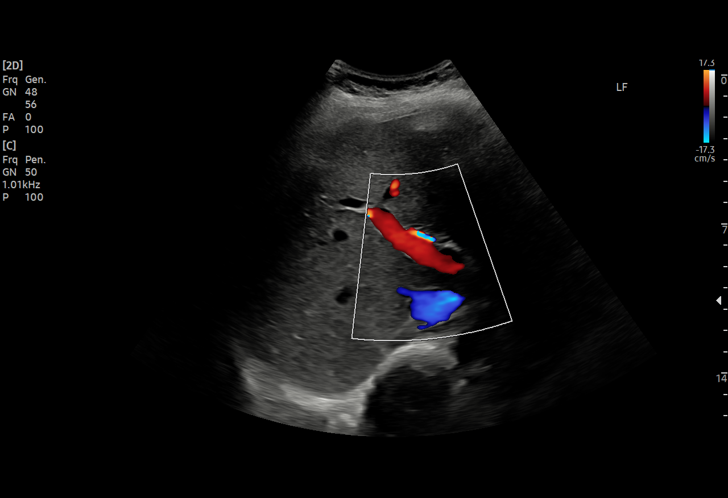
[im 61/61]
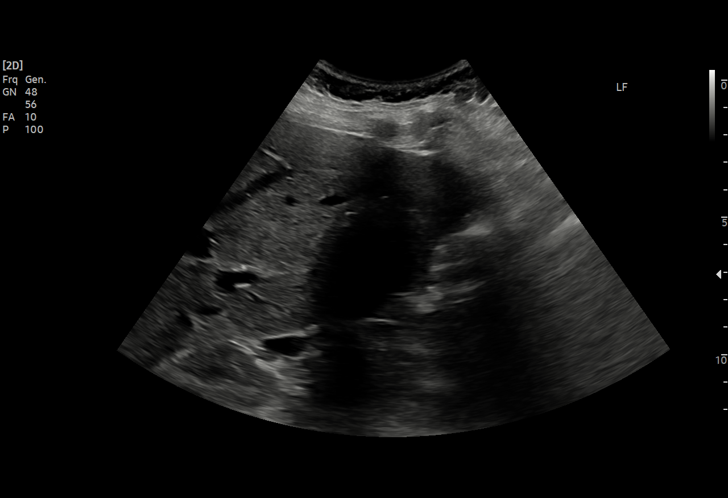

[15 of 25 positions shown; findings below may reference images not displayed]

FINDINGS: Gallbladder:

Gallbladder filled with multiple calculi. No gallbladder wall
thickening or pericholecystic fluid. Sonographic Murphy sign is
present.

Common bile duct:

Diameter: 6 mm, borderline dilated. Only a short segment at the
porta hepatis is visualized.

Liver:

Normal echogenicity without mass or nodularity. Portal vein is
patent on color Doppler imaging with normal direction of blood flow
towards the liver.

Other: No RIGHT upper quadrant free fluid.
IMPRESSION: Cholelithiasis with presence of a sonographic Murphy sign, though no
definite gallbladder wall thickening or pericholecystic fluid are
identified.

Early acute cholecystitis not excluded.

Borderline dilatation of CBD, 6 mm diameter, recommend correlation
with LFTs.

## 2022-07-29 NOTE — L&D Delivery Note (Signed)
Delivery Note Called to room because of presenting with foot at the vaginal introitus. Mother pushed through 2-3 contractions. Legs emerged first and sacrum anterior. Delivered with pushing to umbilicus and then infant spontaneously flexed head and delivered under pubic bone.   At 1:49 PM a viable female was delivered via Vaginal, Breech (Presentation:  Footling ).  APGAR: 8, 9; weight-pending .   Placenta status: Spontaneous, Intact.  Cord: 3 vessels with the following complications: None.  Cord pH: Not collected  Anesthesia: Epidural Episiotomy: None Lacerations: None Suture Repair:  None Est. Blood Loss (mL): 205  Mom to postpartum.  Baby to Couplet care / Skin to Skin.  [name] [facility] 05/22/2023, 2:50 PM

## 2023-02-10 ENCOUNTER — Ambulatory Visit (INDEPENDENT_AMBULATORY_CARE_PROVIDER_SITE_OTHER): Payer: Self-pay | Admitting: *Deleted

## 2023-02-10 VITALS — BP 115/69 | HR 84 | Wt 156.6 lb

## 2023-02-10 DIAGNOSIS — Z3201 Encounter for pregnancy test, result positive: Secondary | ICD-10-CM

## 2023-02-10 DIAGNOSIS — Z789 Other specified health status: Secondary | ICD-10-CM

## 2023-02-10 LAB — POCT URINE PREGNANCY: Preg Test, Ur: POSITIVE — AB

## 2023-02-10 NOTE — Progress Notes (Signed)
   NURSE VISIT- PREGNANCY CONFIRMATION   SUBJECTIVE:  Stacy Wall is a 32 y.o. [phone] female at uncertain LMP of No LMP recorded (lmp unknown). Patient is pregnant. Here for pregnancy confirmation.  Home pregnancy test: positive x 2   Does not have a period as she is still breastfeeding.  Took a test because she was having nausea. She reports no complaints.  She is taking prenatal vitamins.    OBJECTIVE:  BP 115/69 (BP Location: Right Arm, Patient Position: Sitting, Cuff Size: Normal)   Pulse 84   Wt 156 lb 9.6 oz (71 kg)   LMP  (LMP Unknown)   Breastfeeding Yes   BMI 26.06 kg/m   Appears well, in no apparent distress  Results for orders placed or performed in visit on 02/10/23 (from the past 24 hour(s))  POCT urine pregnancy   Collection Time: 02/10/23  4:30 PM  Result Value Ref Range   Preg Test, Ur Positive (A) Negative    ASSESSMENT: Positive pregnancy test, Unknown by LMP    PLAN: Schedule for dating ultrasound TBD based on HCG Prenatal vitamins: continue   Nausea medicines: not currently needed   OB packet given: Yes  Jobe Marker  02/10/2023 4:30 PM

## 2023-02-11 LAB — BETA HCG QUANT (REF LAB): hCG Quant: 8355 m[IU]/mL

## 2023-02-21 ENCOUNTER — Other Ambulatory Visit: Payer: Self-pay | Admitting: Obstetrics & Gynecology

## 2023-02-21 DIAGNOSIS — O3680X0 Pregnancy with inconclusive fetal viability, not applicable or unspecified: Secondary | ICD-10-CM

## 2023-02-24 ENCOUNTER — Other Ambulatory Visit: Payer: Self-pay | Admitting: Obstetrics & Gynecology

## 2023-02-24 ENCOUNTER — Ambulatory Visit (INDEPENDENT_AMBULATORY_CARE_PROVIDER_SITE_OTHER): Payer: Self-pay

## 2023-02-24 DIAGNOSIS — Z3A28 28 weeks gestation of pregnancy: Secondary | ICD-10-CM

## 2023-02-24 DIAGNOSIS — Z363 Encounter for antenatal screening for malformations: Secondary | ICD-10-CM

## 2023-02-24 DIAGNOSIS — O3680X0 Pregnancy with inconclusive fetal viability, not applicable or unspecified: Secondary | ICD-10-CM

## 2023-02-24 NOTE — Progress Notes (Signed)
[place] 28+4 wks,cephalic,posterior placenta gr 0,FHR 142 bpm,normal ovaries,CX 3.1 cm,SVP of fluid 6.3 cm,EFW 1185 g,anatomy complete,no obvious abnormalities

## 2023-03-03 ENCOUNTER — Encounter: Payer: Self-pay | Admitting: Obstetrics and Gynecology

## 2023-03-03 ENCOUNTER — Ambulatory Visit: Payer: Self-pay | Admitting: *Deleted

## 2023-03-03 DIAGNOSIS — Z3490 Encounter for supervision of normal pregnancy, unspecified, unspecified trimester: Secondary | ICD-10-CM | POA: Insufficient documentation

## 2023-04-21 ENCOUNTER — Encounter (HOSPITAL_COMMUNITY): Payer: Self-pay | Admitting: Family Medicine

## 2023-04-21 ENCOUNTER — Inpatient Hospital Stay (HOSPITAL_COMMUNITY)
Admission: AD | Admit: 2023-04-21 | Discharge: 2023-04-21 | Disposition: A | Discharge status: Home / Self Care | Payer: Self-pay | Attending: Family Medicine | Admitting: Family Medicine

## 2023-04-21 DIAGNOSIS — Z0371 Encounter for suspected problem with amniotic cavity and membrane ruled out: Secondary | ICD-10-CM | POA: Insufficient documentation

## 2023-04-21 DIAGNOSIS — Z3A36 36 weeks gestation of pregnancy: Secondary | ICD-10-CM | POA: Insufficient documentation

## 2023-04-21 LAB — RUPTURE OF MEMBRANE (ROM)PLUS: Rom Plus: NEGATIVE

## 2023-04-21 LAB — POCT FERN TEST

## 2023-04-21 NOTE — MAU Provider Note (Signed)
Chief Complaint: Rupture of Membranes   SUBJECTIVE HPI: Stacy Wall is a 32 y.o. G5P4004 at [redacted]w[redacted]d by midtrimester ultrasound who presents to maternity admissions reporting possible ROM.  Patient was using the restroom. Stood up and heard a "pop" then had a trickle of fluid. +FM. Rare contractions. Denies VB, change in discharge, urinary symptoms.  HPI  Past Medical History:  Diagnosis Date   Chronic back pain    Gallstones    Herpes    Medical history non-contributory    Past Surgical History:  Procedure Laterality Date   CHOLECYSTECTOMY     Social History   Socioeconomic History   Marital status: Married    Spouse name: Engineer, petroleum   Number of children: 2   Years of education: Not on file   Highest education level: 10th grade  Occupational History   Not on file  Tobacco Use   Smoking status: Never   Smokeless tobacco: Never  Vaping Use   Vaping status: Never Used  Substance and Sexual Activity   Alcohol use: No   Drug use: No   Sexual activity: Yes    Birth control/protection: None  Other Topics Concern   Not on file  Social History Narrative   Not on file   Social Determinants of Health   Financial Resource Strain: Low Risk  (04/28/2018)   Overall Financial Resource Strain (CARDIA)    Difficulty of Paying Living Expenses: Not hard at all  Food Insecurity: No Food Insecurity (04/28/2018)   Hunger Vital Sign    Worried About Running Out of Food in the Last Year: Never true    Ran Out of Food in the Last Year: Never true  Transportation Needs: No Transportation Needs (04/28/2018)   PRAPARE - Administrator, Civil Service (Medical): No    Lack of Transportation (Non-Medical): No  Physical Activity: Insufficiently Active (04/28/2018)   Exercise Vital Sign    Days of Exercise per Week: 2 days    Minutes of Exercise per Session: 30 min  Stress: No Stress Concern Present (04/28/2018)   [employer] of Occupational Health - Occupational  Stress Questionnaire    Feeling of Stress : Not at all  Social Connections: Somewhat Isolated (04/28/2018)   Social Connection and Isolation Panel [NHANES]    Frequency of Communication with Friends and Family: More than three times a week    Frequency of Social Gatherings with Friends and Family: Once a week    Attends Religious Services: Never    Database administrator or Organizations: No    Attends Banker Meetings: Never    Marital Status: Living with partner  Intimate Partner Violence: Not At Risk (04/28/2018)   Humiliation, Afraid, Rape, and Kick questionnaire    Fear of Current or Ex-Partner: No    Emotionally Abused: No    Physically Abused: No    Sexually Abused: No   No current facility-administered medications on file prior to encounter.   Current Outpatient Medications on File Prior to Encounter  Medication Sig Dispense Refill   Prenatal Vit-Fe Fumarate-FA (PRENATAL VITAMIN PO) Take by mouth.     No Known Allergies  ROS:  Pertinent positives/negatives listed above.  I have reviewed patient's Past Medical Hx, Surgical Hx, Family Hx, Social Hx, medications and allergies.   Physical Exam  Patient Vitals for the past 24 hrs:  BP Temp Temp src Pulse Resp SpO2  04/21/23 0301 -- -- -- -- -- 97 %  04/21/23 0300 -- -- -- -- --  97 %  04/21/23 0256 -- -- -- -- -- 97 %  04/21/23 0251 -- -- -- -- -- 97 %  04/21/23 0246 -- -- -- -- -- 98 %  04/21/23 0231 -- -- -- -- -- 97 %  04/21/23 0230 -- -- -- -- -- 97 %  04/21/23 0226 -- -- -- -- -- 97 %  04/21/23 0221 -- -- -- -- -- 97 %  04/21/23 0216 -- -- -- -- -- 97 %  04/21/23 0211 -- -- -- -- -- 97 %  04/21/23 0206 -- -- -- -- -- 97 %  04/21/23 0201 -- -- -- -- -- 97 %  04/21/23 0156 -- -- -- -- -- 99 %  04/21/23 0151 -- -- -- -- -- 98 %  04/21/23 0150 120/73 98.3 F (36.8 C) Oral 68 16 100 %   Constitutional: Well-developed, well-nourished female in no acute distress.  Cardiovascular: normal  rate Respiratory: normal effort GI: Abd soft, non-tender MS: Extremities nontender, no edema, normal ROM Neurologic: Alert and oriented x 4.   FHT:  Baseline 120 , moderate variability, accelerations present, no decelerations Contractions: rare/irregular  LAB RESULTS Results for orders placed or performed during the hospital encounter of 04/21/23 (from the past 24 hour(s))  Rupture of Membrane (ROM) Plus     Status: None   Collection Time: 04/21/23  2:17 AM  Result Value Ref Range   Rom Plus NEGATIVE   Fern Test     Status: Normal   Collection Time: 04/21/23  2:18 AM  Result Value Ref Range   POCT Fern Test     Negative        IMAGING No results found.  MAU Management/MDM: Orders Placed This Encounter  Procedures   Rupture of Membrane (ROM) Plus   Fern Test   Discharge patient    No orders of the defined types were placed in this encounter.   Pt's fern and ROM plus were found negative. Membranes intact. Discussed return labor precautions.  ASSESSMENT 1. Encounter for suspected premature rupture of amniotic membranes, with rupture of membranes not found   2. [redacted] weeks gestation of pregnancy     PLAN Discharge home with strict return precautions. Allergies as of 04/21/2023   No Known Allergies      Medication List     TAKE these medications    PRENATAL VITAMIN PO Take by mouth.         Wylene Simmer, MD OB Fellow 04/21/2023  3:34 AM

## 2023-04-21 NOTE — MAU Note (Signed)
..  Stacy Wall is a 32 y.o. at [redacted]w[redacted]d here in MAU reporting: Had a pop and then gush of fluid at 8 pm, has not leaked since.  Reports baby has had "unusual" movement this evening, more than normal.  Denies pain or vaginal bleeding. Pain score: 0/10 Vitals:   04/21/23 0150  BP: 120/73  Pulse: 68  Resp: 16  Temp: 98.3 F (36.8 C)  SpO2: 100%     AVW:[id number] in room  Lab orders placed from triage: fern

## 2023-05-15 ENCOUNTER — Ambulatory Visit (INDEPENDENT_AMBULATORY_CARE_PROVIDER_SITE_OTHER): Payer: Self-pay | Admitting: Women's Health

## 2023-05-15 ENCOUNTER — Encounter: Payer: Self-pay | Admitting: Women's Health

## 2023-05-15 ENCOUNTER — Ambulatory Visit: Payer: Self-pay | Admitting: *Deleted

## 2023-05-15 ENCOUNTER — Other Ambulatory Visit (HOSPITAL_COMMUNITY)
Admission: RE | Admit: 2023-05-15 | Discharge: 2023-05-15 | Disposition: A | Discharge status: Home / Self Care | Payer: Self-pay | Source: Ambulatory Visit | Attending: Women's Health | Admitting: Women's Health

## 2023-05-15 VITALS — BP 114/75 | HR 75 | Wt 186.0 lb

## 2023-05-15 DIAGNOSIS — Z124 Encounter for screening for malignant neoplasm of cervix: Secondary | ICD-10-CM

## 2023-05-15 DIAGNOSIS — B009 Herpesviral infection, unspecified: Secondary | ICD-10-CM

## 2023-05-15 DIAGNOSIS — O0930 Supervision of pregnancy with insufficient antenatal care, unspecified trimester: Secondary | ICD-10-CM

## 2023-05-15 DIAGNOSIS — Z3480 Encounter for supervision of other normal pregnancy, unspecified trimester: Secondary | ICD-10-CM | POA: Insufficient documentation

## 2023-05-15 DIAGNOSIS — O0933 Supervision of pregnancy with insufficient antenatal care, third trimester: Secondary | ICD-10-CM

## 2023-05-15 DIAGNOSIS — Z3A40 40 weeks gestation of pregnancy: Secondary | ICD-10-CM

## 2023-05-15 DIAGNOSIS — Z131 Encounter for screening for diabetes mellitus: Secondary | ICD-10-CM

## 2023-05-15 DIAGNOSIS — Z3483 Encounter for supervision of other normal pregnancy, third trimester: Secondary | ICD-10-CM

## 2023-05-15 NOTE — Addendum Note (Signed)
Addended by: Federico Flake A on: 05/15/2023 10:43 AM   Modules accepted: Orders

## 2023-05-15 NOTE — Progress Notes (Signed)
INITIAL OBSTETRICAL VISIT Patient name: Stacy Wall MRN [id number]  Date of birth: 07-07-1991 Chief Complaint:   Initial Prenatal Visit  History of Present Illness:   Stacy Wall is a 32 y.o. G5P4004 Hispanic female at [redacted]w[redacted]d by [place] at 34 weeks with an Estimated Date of Delivery: 05/15/23 being seen today for her initial obstetrical visit.  She is late to care d/t being self-pay. No LMP recorded. Patient is pregnant. Her obstetrical history is significant for  term SVB x 4 .   Today she reports  some contractions. On valtrex since 37wks, last outbreak ago .  Last pap 2019. Results were: negative per pt report at [facility]     05/15/2023    9:53 AM 04/11/2021    9:52 AM 03/16/2021    9:38 AM 04/28/2018   10:42 AM  Depression screen PHQ 2/9  Decreased Interest 0 0 0 0  Down, Depressed, Hopeless 0 0 0 0  PHQ - 2 Score 0 0 0 0  Altered sleeping 0   0  Tired, decreased energy 0   0  Change in appetite 0   0  Feeling bad or failure about yourself  0   0  Trouble concentrating 0   0  Moving slowly or fidgety/restless 0   0  Suicidal thoughts 0   0  PHQ-9 Score 0   0  Difficult doing work/chores    Not difficult at all        05/15/2023    9:53 AM 04/11/2021    9:52 AM 03/16/2021    9:38 AM  GAD 7 : Generalized Anxiety Score  Nervous, Anxious, on Edge 0 0 0  Control/stop worrying 0 0 0  Worry too much - different things 0 0 0  Trouble relaxing 0 0 0  Restless 0 0 0  Easily annoyed or irritable 0 0 0  Afraid - awful might happen 0 0 0  Total GAD 7 Score 0 0 0  Anxiety Difficulty  Not difficult at all Not difficult at all     Review of Systems:   Pertinent items are noted in HPI Denies cramping/contractions, leakage of fluid, vaginal bleeding, abnormal vaginal discharge w/ itching/odor/irritation, headaches, visual changes, shortness of breath, chest pain, abdominal pain, severe nausea/vomiting, or problems with urination or bowel movements unless otherwise stated  above.  Pertinent History Reviewed:  Reviewed past medical,surgical, social, obstetrical and family history.  Reviewed problem list, medications and allergies. OB History  Gravida Para Term Preterm AB Living  5 4 4     4   SAB IAB Ectopic Multiple Live Births        0 4    # Outcome Date GA Lbr Len/2nd Weight Sex Type Anes PTL Lv  5 Current           4 Term 11/21/20 106w3d  6 lb 14 oz (3.118 kg) F Vag-Spont None  LIV  3 Term 08/03/18 [redacted]w[redacted]d  6 lb 12 oz (3.062 kg) F Vag-Spont  N LIV  2 Term 12/30/10 [redacted]w[redacted]d  7 lb 3 oz (3.26 kg) F Vag-Spont EPI N LIV  1 Term 08/19/09 [redacted]w[redacted]d  6 lb 3 oz (2.807 kg) M Vag-Spont EPI N LIV   Physical Assessment:   Vitals:   05/15/23 0946  BP: 114/75  Pulse: 75  Weight: 186 lb (84.4 kg)  Body mass index is 30.95 kg/m.       Physical Examination:  General appearance - well appearing, and in no  distress  Mental status - alert, oriented to person, place, and time  Psych:  She has a normal mood and affect  Skin - warm and dry, normal color, no suspicious lesions noted  Chest - effort normal, all lung fields clear to auscultation bilaterally  Heart - normal rate and regular rhythm  Abdomen - soft, nontender  Extremities:  No swelling or varicosities noted  Pelvic - VULVA: normal appearing vulva with no masses, tenderness or lesions  VAGINA: normal appearing vagina with normal color and discharge, no lesions  CERVIX: normal appearing cervix without discharge or lesions, no CMT  SVE: 1.5/th/ballotable, vtx  Thin prep pap is done w/ reflex HR HPV cotesting  Chaperone: Federico Flake    FH: 38cm  FHR: 135 via doppler  No results found for this or any previous visit (from the past 24 hour(s)).  Assessment & Plan:  1) Low-Risk Pregnancy G5P4004 at [redacted]w[redacted]d with an Estimated Date of Delivery: 05/15/23   2) Initial OB visit  3) Late care d/t self-pay- postdates IOL scheduled for 10/24 @[place] ,  IOL form faxed and orders placed   4) HSV> on valtrex since 37wks, last  outbreak ago  5) Cervical cancer screening> pap today  Meds: No orders of the defined types were placed in this encounter.   Initial labs obtained Continue prenatal vitamins Reviewed labor s/s, reasons to seek care Genetic & carrier screening discussed: declined/too late Ultrasound discussed; fetal survey: results reviewed CCNC completed> form faxed if has or is planning to apply for medicaid The nature of [facility] [employer] with multiple MDs and other Advanced Practice Providers was explained to patient; also emphasized that fellows, residents, and students are part of our team. Does not have home bp cuff. Office bp cuff given: yes. Rx sent: no. Check bp weekly, let us know if consistently >140/90.   Follow-up: Return for will schedule pp visit after delivery.   Orders Placed This Encounter  Procedures   Urine Culture   Culture, beta strep (group b only)   GC/Chlamydia Probe Amp   CBC/D/Plt+RPR+Rh+ABO+RubIgG...   Hemoglobin A1c    Cheral Marker CNM, [facility] 05/15/2023 10:27 AM

## 2023-05-15 NOTE — Patient Instructions (Signed)
Stacy Wall, thank you for choosing our office today! We appreciate the opportunity to meet your healthcare needs. You may receive a short survey by mail, e-mail, or through [name]. If you are happy with your care we would appreciate if you could take just a few minutes to complete the survey questions. We read all of your comments and take your feedback very seriously. Thank you again for choosing our office.  Center for [employer] Team at [facility]  [facility] at [facility] ([address] [place], [place] [address]) Entrance C, located off of [address] Free 24/7 valet parking   CLASSES: Go to [name] to register for classes (childbirth, breastfeeding, waterbirth, infant CPR, daddy bootcamp, etc.)  Call the office [phone]) or go to [facility] if: You begin to have strong, frequent contractions Your water breaks.  Sometimes it is a big gush of fluid, sometimes it is just a trickle that keeps getting your panties wet or running down your legs You have vaginal bleeding.  It is normal to have a small amount of spotting if your cervix was checked.  You don't feel your baby moving like normal.  If you don't, get you something to eat and drink and lay down and focus on feeling your baby move.   If your baby is still not moving like normal, you should call the office or go to [name].  Call the office [phone]) or go to [name] for these signs of pre-eclampsia: Severe headache that does not go away with Tylenol Visual changes- seeing spots, double, blurred vision Pain under your right breast or upper abdomen that does not go away with Tums or heartburn medicine Nausea and/or vomiting Severe swelling in your hands, feet, and face   [name] Pediatricians/Family Doctors [facility]): [address] Colette Ribas, [phone]           [facility]: [address], [phone]                 [facility]): [address], [phone] (call to ask if accepting patients) [facility] Department: [address] [place], [phone]    [facility] Pediatricians/Family Doctors [facility]): [address], [phone] [facility]: [address] [place], [phone] [facility]: [address], [phone]  [facility] Doctors  [facility]): [phone] [facility]: [address], [phone]   [facility] Trust Doctors [facility]: [address] [phone]  [facility] Doctors  [facility]: [phone], [phone]  Home Blood Pressure Monitoring for Patients   Your provider has recommended that you check your blood pressure (BP) at least once a week at home. If you do not have a blood pressure cuff at home, one will be provided for you. Contact your provider if you have not received your monitor within 1 week.   Helpful Tips for Accurate Home Blood Pressure Checks  Don't smoke, exercise, or drink caffeine 30 minutes before checking your BP Use the restroom before checking your BP (a full bladder can raise your pressure) Relax in a comfortable upright chair Feet on the ground Left arm resting comfortably on a flat surface at the level of your heart Legs uncrossed Back supported Sit quietly and don't talk Place the cuff on your bare arm Adjust snuggly, so that only two fingertips  can fit between your skin and the top of the cuff Check 2 readings separated by at least one minute Keep a log of your BP readings For a visual, please reference this diagram: http://ccnc.care/bpdiagram  Provider Name: [facility]     Phone: [phone]  Zone 1: ALL CLEAR  Continue to monitor your symptoms:  BP reading is less than 140 (top number) or less than 90 (bottom number)  No right  upper stomach pain No headaches or seeing spots No feeling nauseated or throwing up No swelling in face and hands  Zone 2: CAUTION Call your doctor's office for any of the following:  BP reading is greater than 140 (top number) or greater than 90 (bottom number)  Stomach pain under your ribs in the middle or right side Headaches or seeing spots Feeling nauseated or throwing up Swelling in face and hands  Zone 3: EMERGENCY  Seek immediate medical care if you have any of the following:  BP reading is greater than160 (top number) or greater than 110 (bottom number) Severe headaches not improving with Tylenol Serious difficulty catching your breath Any worsening symptoms from Zone 2   Braxton Hicks Contractions Contractions of the uterus can occur throughout pregnancy, but they are not always a sign that you are in labor. You may have practice contractions called Braxton Hicks contractions. These false labor contractions are sometimes confused with true labor. What are Stacy Wall contractions? Braxton Hicks contractions are tightening movements that occur in the muscles of the uterus before labor. Unlike true labor contractions, these contractions do not result in opening (dilation) and thinning of the cervix. Toward the end of pregnancy (32-34 weeks), Braxton Hicks contractions can happen more often and may become stronger. These contractions are sometimes difficult to tell apart from true labor because they can be very uncomfortable. You should not feel embarrassed if you go to the hospital with false labor. Sometimes, the only way to tell if you are in true labor is for your health care provider to look for changes in the cervix. The health care provider will do a physical exam and may monitor your contractions. If you are not in true labor, the exam should show that your cervix is not dilating and your water has not broken. If there are no other health problems associated with your  pregnancy, it is completely safe for you to be sent home with false labor. You may continue to have Braxton Hicks contractions until you go into true labor. How to tell the difference between true labor and false labor True labor Contractions last 30-70 seconds. Contractions become very regular. Discomfort is usually felt in the top of the uterus, and it spreads to the lower abdomen and low back. Contractions do not go away with walking. Contractions usually become more intense and increase in frequency. The cervix dilates and gets thinner. False labor Contractions are usually shorter and not as strong as true labor contractions. Contractions are usually irregular. Contractions are often felt in the front of the lower abdomen and in the groin. Contractions may go away when you walk around or change positions while lying down. Contractions get weaker and are shorter-lasting as time goes on. The cervix usually does not dilate or become thin. Follow these instructions at home:  Take over-the-counter and prescription medicines only as told by your health care provider. Keep up with your usual exercises and follow other instructions from your health care provider. Eat and drink lightly if you think  you are going into labor. If Braxton Hicks contractions are making you uncomfortable: Change your position from lying down or resting to walking, or change from walking to resting. Sit and rest in a tub of warm water. Drink enough fluid to keep your urine pale yellow. Dehydration may cause these contractions. Do slow and deep breathing several times an hour. Keep all follow-up prenatal visits as told by your health care provider. This is important. Contact a health care provider if: You have a fever. You have continuous pain in your abdomen. Get help right away if: Your contractions become stronger, more regular, and closer together. You have fluid leaking or gushing from your vagina. You pass  blood-tinged mucus (bloody show). You have bleeding from your vagina. You have low back pain that you never had before. You feel your baby's head pushing down and causing pelvic pressure. Your baby is not moving inside you as much as it used to. Summary Contractions that occur before labor are called Braxton Hicks contractions, false labor, or practice contractions. Braxton Hicks contractions are usually shorter, weaker, farther apart, and less regular than true labor contractions. True labor contractions usually become progressively stronger and regular, and they become more frequent. Manage discomfort from [facility] contractions by changing position, resting in a warm bath, drinking plenty of water, or practicing deep breathing. This information is not intended to replace advice given to you by your health care provider. Make sure you discuss any questions you have with your health care provider. Document Revised: 06/27/2017 Document Reviewed: 11/28/2016 Elsevier Patient Education  2020 [employer].

## 2023-05-16 ENCOUNTER — Telehealth (HOSPITAL_COMMUNITY): Payer: Self-pay | Admitting: *Deleted

## 2023-05-16 ENCOUNTER — Encounter (HOSPITAL_COMMUNITY): Payer: Self-pay | Admitting: *Deleted

## 2023-05-16 NOTE — Telephone Encounter (Signed)
Preadmission screen  

## 2023-05-18 LAB — URINE CULTURE

## 2023-05-19 LAB — CULTURE, BETA STREP (GROUP B ONLY): Strep Gp B Culture: NEGATIVE

## 2023-05-21 LAB — CYTOLOGY - PAP
Chlamydia: NEGATIVE
Comment: NEGATIVE
Comment: NEGATIVE
Comment: NORMAL
Diagnosis: NEGATIVE
High risk HPV: NEGATIVE
Neisseria Gonorrhea: NEGATIVE

## 2023-05-22 ENCOUNTER — Inpatient Hospital Stay (HOSPITAL_COMMUNITY): Payer: MEDICAID | Admitting: Anesthesiology

## 2023-05-22 ENCOUNTER — Other Ambulatory Visit: Payer: Self-pay

## 2023-05-22 ENCOUNTER — Inpatient Hospital Stay (HOSPITAL_COMMUNITY)
Admission: RE | Admit: 2023-05-22 | Discharge: 2023-05-23 | DRG: 806 | Disposition: A | Discharge status: Home / Self Care | Payer: MEDICAID | Attending: Family Medicine | Admitting: Family Medicine

## 2023-05-22 ENCOUNTER — Inpatient Hospital Stay (HOSPITAL_COMMUNITY): Payer: MEDICAID

## 2023-05-22 ENCOUNTER — Encounter (HOSPITAL_COMMUNITY): Payer: Self-pay | Admitting: Obstetrics & Gynecology

## 2023-05-22 DIAGNOSIS — Z555 Less than a high school diploma: Secondary | ICD-10-CM

## 2023-05-22 DIAGNOSIS — B009 Herpesviral infection, unspecified: Secondary | ICD-10-CM | POA: Diagnosis present

## 2023-05-22 DIAGNOSIS — Z3A41 41 weeks gestation of pregnancy: Secondary | ICD-10-CM

## 2023-05-22 DIAGNOSIS — O0930 Supervision of pregnancy with insufficient antenatal care, unspecified trimester: Secondary | ICD-10-CM

## 2023-05-22 DIAGNOSIS — Z3483 Encounter for supervision of other normal pregnancy, third trimester: Secondary | ICD-10-CM

## 2023-05-22 DIAGNOSIS — Z833 Family history of diabetes mellitus: Secondary | ICD-10-CM

## 2023-05-22 DIAGNOSIS — Z8041 Family history of malignant neoplasm of ovary: Secondary | ICD-10-CM

## 2023-05-22 DIAGNOSIS — O328XX0 Maternal care for other malpresentation of fetus, not applicable or unspecified: Secondary | ICD-10-CM | POA: Diagnosis present

## 2023-05-22 DIAGNOSIS — A6000 Herpesviral infection of urogenital system, unspecified: Secondary | ICD-10-CM | POA: Diagnosis present

## 2023-05-22 DIAGNOSIS — Z803 Family history of malignant neoplasm of breast: Secondary | ICD-10-CM | POA: Diagnosis not present

## 2023-05-22 DIAGNOSIS — Z3490 Encounter for supervision of normal pregnancy, unspecified, unspecified trimester: Secondary | ICD-10-CM

## 2023-05-22 DIAGNOSIS — O480 Post-term pregnancy: Principal | ICD-10-CM | POA: Diagnosis present

## 2023-05-22 DIAGNOSIS — O99214 Obesity complicating childbirth: Secondary | ICD-10-CM | POA: Diagnosis present

## 2023-05-22 DIAGNOSIS — O321XX0 Maternal care for breech presentation, not applicable or unspecified: Principal | ICD-10-CM | POA: Diagnosis not present

## 2023-05-22 DIAGNOSIS — O0933 Supervision of pregnancy with insufficient antenatal care, third trimester: Secondary | ICD-10-CM

## 2023-05-22 DIAGNOSIS — O9832 Other infections with a predominantly sexual mode of transmission complicating childbirth: Secondary | ICD-10-CM | POA: Diagnosis present

## 2023-05-22 LAB — TYPE AND SCREEN
ABO/RH(D): O POS
Antibody Screen: NEGATIVE

## 2023-05-22 LAB — HEPATITIS B SURFACE ANTIGEN: Hepatitis B Surface Ag: NONREACTIVE

## 2023-05-22 LAB — CBC
HCT: 35.1 % — ABNORMAL LOW (ref 36.0–46.0)
Hemoglobin: 12 g/dL (ref 12.0–15.0)
MCH: 30 pg (ref 26.0–34.0)
MCHC: 34.2 g/dL (ref 30.0–36.0)
MCV: 87.8 fL (ref 80.0–100.0)
Platelets: 219 10*3/uL (ref 150–400)
RBC: 4 MIL/uL (ref 3.87–5.11)
RDW: 13.2 % (ref 11.5–15.5)
WBC: 9.6 10*3/uL (ref 4.0–10.5)
nRBC: 0 % (ref 0.0–0.2)

## 2023-05-22 LAB — RPR: RPR Ser Ql: NONREACTIVE

## 2023-05-22 LAB — HIV ANTIBODY (ROUTINE TESTING W REFLEX): HIV Screen 4th Generation wRfx: NONREACTIVE

## 2023-05-22 LAB — HEMOGLOBIN A1C
Hgb A1c MFr Bld: 4.9 % (ref 4.8–5.6)
Mean Plasma Glucose: 93.93 mg/dL

## 2023-05-22 MED ORDER — COCONUT OIL OIL: 1.0000 | TOPICAL_OIL | Status: DC | PRN

## 2023-05-22 MED ORDER — TRANEXAMIC ACID-NACL 1000-0.7 MG/100ML-% IV SOLN
INTRAVENOUS | Status: AC
  Filled 2023-05-22: qty 100

## 2023-05-22 MED ORDER — ONDANSETRON HCL 4 MG/2ML IJ SOLN: 4.0000 mg | INTRAMUSCULAR | Status: DC | PRN

## 2023-05-22 MED ORDER — SIMETHICONE 80 MG PO CHEW: 80.0000 mg | CHEWABLE_TABLET | ORAL | Status: DC | PRN

## 2023-05-22 MED ORDER — PRENATAL MULTIVITAMIN CH
1.0000 | ORAL_TABLET | Freq: Every day | ORAL | Status: DC
  Administered 2023-05-23: 1 via ORAL
  Filled 2023-05-22: qty 1

## 2023-05-22 MED ORDER — BENZOCAINE-MENTHOL 20-0.5 % EX AERO: 1.0000 | INHALATION_SPRAY | CUTANEOUS | Status: DC | PRN

## 2023-05-22 MED ORDER — OXYTOCIN BOLUS FROM INFUSION
333.0000 mL | Freq: Once | INTRAVENOUS | Status: AC
  Administered 2023-05-22: 333 mL via INTRAVENOUS

## 2023-05-22 MED ORDER — FENTANYL CITRATE (PF) 100 MCG/2ML IJ SOLN: 100.0000 ug | INTRAMUSCULAR | Status: DC | PRN

## 2023-05-22 MED ORDER — LACTATED RINGERS IV SOLN: 500.0000 mL | INTRAVENOUS | Status: DC | PRN

## 2023-05-22 MED ORDER — ACETAMINOPHEN 325 MG PO TABS: 650.0000 mg | ORAL_TABLET | ORAL | Status: DC | PRN

## 2023-05-22 MED ORDER — OXYCODONE HCL 5 MG PO TABS: 10.0000 mg | ORAL_TABLET | ORAL | Status: DC | PRN

## 2023-05-22 MED ORDER — PHENYLEPHRINE 80 MCG/ML (10ML) SYRINGE FOR IV PUSH (FOR BLOOD PRESSURE SUPPORT): 80.0000 ug | PREFILLED_SYRINGE | INTRAVENOUS | Status: DC | PRN

## 2023-05-22 MED ORDER — OXYTOCIN-SODIUM CHLORIDE 30-0.9 UT/500ML-% IV SOLN
1.0000 m[IU]/min | INTRAVENOUS | Status: DC
  Administered 2023-05-22: 2 m[IU]/min via INTRAVENOUS
  Administered 2023-05-22: 4 m[IU]/min via INTRAVENOUS

## 2023-05-22 MED ORDER — HYDROXYZINE HCL 50 MG PO TABS: 50.0000 mg | ORAL_TABLET | Freq: Four times a day (QID) | ORAL | Status: DC | PRN

## 2023-05-22 MED ORDER — LACTATED RINGERS IV SOLN: 500.0000 mL | Freq: Once | INTRAVENOUS | Status: DC

## 2023-05-22 MED ORDER — DIBUCAINE (PERIANAL) 1 % EX OINT: 1.0000 | TOPICAL_OINTMENT | CUTANEOUS | Status: DC | PRN

## 2023-05-22 MED ORDER — DOCUSATE SODIUM 100 MG PO CAPS
100.0000 mg | ORAL_CAPSULE | Freq: Two times a day (BID) | ORAL | Status: DC
  Administered 2023-05-23 (×2): 100 mg via ORAL
  Filled 2023-05-22 (×2): qty 1

## 2023-05-22 MED ORDER — DIPHENHYDRAMINE HCL 50 MG/ML IJ SOLN: 12.5000 mg | INTRAMUSCULAR | Status: DC | PRN

## 2023-05-22 MED ORDER — OXYCODONE-ACETAMINOPHEN 5-325 MG PO TABS: 2.0000 | ORAL_TABLET | ORAL | Status: DC | PRN

## 2023-05-22 MED ORDER — TERBUTALINE SULFATE 1 MG/ML IJ SOLN: 0.2500 mg | Freq: Once | INTRAMUSCULAR | Status: DC | PRN

## 2023-05-22 MED ORDER — ONDANSETRON HCL 4 MG/2ML IJ SOLN: 4.0000 mg | Freq: Four times a day (QID) | INTRAMUSCULAR | Status: DC | PRN

## 2023-05-22 MED ORDER — LIDOCAINE HCL (PF) 1 % IJ SOLN: 30.0000 mL | INTRAMUSCULAR | Status: DC | PRN

## 2023-05-22 MED ORDER — IBUPROFEN 600 MG PO TABS
600.0000 mg | ORAL_TABLET | Freq: Four times a day (QID) | ORAL | Status: DC
  Administered 2023-05-23 (×2): 600 mg via ORAL
  Filled 2023-05-22 (×2): qty 1

## 2023-05-22 MED ORDER — DIPHENHYDRAMINE HCL 25 MG PO CAPS: 25.0000 mg | ORAL_CAPSULE | Freq: Four times a day (QID) | ORAL | Status: DC | PRN

## 2023-05-22 MED ORDER — OXYCODONE-ACETAMINOPHEN 5-325 MG PO TABS: 1.0000 | ORAL_TABLET | ORAL | Status: DC | PRN

## 2023-05-22 MED ORDER — FLEET ENEMA RE ENEM: 1.0000 | ENEMA | RECTAL | Status: DC | PRN

## 2023-05-22 MED ORDER — ONDANSETRON HCL 4 MG PO TABS: 4.0000 mg | ORAL_TABLET | ORAL | Status: DC | PRN

## 2023-05-22 MED ORDER — WITCH HAZEL-GLYCERIN EX PADS
1.0000 | MEDICATED_PAD | CUTANEOUS | Status: DC | PRN
Start: 2023-05-22 — End: 2023-05-23

## 2023-05-22 MED ORDER — OXYCODONE HCL 5 MG PO TABS: 5.0000 mg | ORAL_TABLET | ORAL | Status: DC | PRN

## 2023-05-22 MED ORDER — LACTATED RINGERS IV SOLN: INTRAVENOUS | Status: DC

## 2023-05-22 MED ORDER — MISOPROSTOL 25 MCG QUARTER TABLET
25.0000 ug | ORAL_TABLET | Freq: Once | ORAL | Status: AC
  Administered 2023-05-22: 25 ug via VAGINAL

## 2023-05-22 MED ORDER — MISOPROSTOL 50MCG HALF TABLET
50.0000 ug | ORAL_TABLET | Freq: Once | ORAL | Status: AC
  Administered 2023-05-22: 50 ug via BUCCAL

## 2023-05-22 MED ORDER — LIDOCAINE HCL (PF) 1 % IJ SOLN
INTRAMUSCULAR | Status: DC | PRN
  Administered 2023-05-22: 5 mL via EPIDURAL
  Administered 2023-05-22: 2 mL via EPIDURAL
  Administered 2023-05-22: 3 mL via EPIDURAL

## 2023-05-22 MED ORDER — MISOPROSTOL 50MCG HALF TABLET
50.0000 ug | ORAL_TABLET | Freq: Once | ORAL | Status: DC
  Filled 2023-05-22: qty 1

## 2023-05-22 MED ORDER — FENTANYL-BUPIVACAINE-NACL 0.5-0.125-0.9 MG/250ML-% EP SOLN
12.0000 mL/h | EPIDURAL | Status: DC | PRN
  Administered 2023-05-22: 12 mL/h via EPIDURAL
  Filled 2023-05-22: qty 250

## 2023-05-22 MED ORDER — MISOPROSTOL 25 MCG QUARTER TABLET
25.0000 ug | ORAL_TABLET | Freq: Once | ORAL | Status: DC
  Filled 2023-05-22: qty 1

## 2023-05-22 MED ORDER — EPHEDRINE 5 MG/ML INJ: 10.0000 mg | INTRAVENOUS | Status: DC | PRN

## 2023-05-22 MED ORDER — VALACYCLOVIR HCL 500 MG PO TABS
500.0000 mg | ORAL_TABLET | Freq: Two times a day (BID) | ORAL | Status: DC
Start: 2023-05-22 — End: 2023-05-22
  Administered 2023-05-22: 500 mg via ORAL
  Filled 2023-05-22: qty 1

## 2023-05-22 MED ORDER — SOD CITRATE-CITRIC ACID 500-334 MG/5ML PO SOLN: 30.0000 mL | ORAL | Status: DC | PRN

## 2023-05-22 MED ORDER — OXYTOCIN-SODIUM CHLORIDE 30-0.9 UT/500ML-% IV SOLN
2.5000 [IU]/h | INTRAVENOUS | Status: DC
  Filled 2023-05-22: qty 500

## 2023-05-22 NOTE — Progress Notes (Signed)
Labor Progress Note  Trayce Danger is a 32 y.o. G5P4004 at [redacted]w[redacted]d presented for PDIOL.   S: comfortable, no concerns   O:  BP 130/73   Pulse 69   Temp 98.1 F (36.7 C) (Oral)   Resp 14   Ht 5\' 5"  (1.651 m)   Wt 84.2 kg   SpO2 99%   BMI 30.90 kg/m     CVE: Dilation: 4 Effacement (%): 80 Cervical Position: Anterior Station: -2 Presentation: Vertex Exam by:: Dr. Judd Lien   A&P: 32 y.o. [id number] [redacted]w[redacted]d  here for PDIOL as above  #Labor: Progressing well. AROM thin meconium stained fluid, mom and baby doing well.  #Pain: per patient request  #GBS negative  #Late to Advanced Pain Management: Established at 40 wks. Do have dating based upon 28 wk U/S. Do not have all prenatal labs available. Will draw at admission  #Hx HSV: on valtrex since 37 weeks. Has not missed doses. Denies any sign of outbreak and has no lesions on external genitalia  Hessie Dibble, MD FMOB Fellow, Faculty practice [facility], Center for [facility] 05/22/23  12:20 PM

## 2023-05-22 NOTE — Lactation Note (Signed)
This note was copied from a baby's chart. Lactation Consultation Note  Patient Name: Stacy Wall Date: 05/22/2023 Age:30 hours Reason for consult: Initial assessment;Term  P5- [name] is an experienced mom with breastfeeding. [name] states that infant is nursing great and denies having any pain or pinching. [name] requests for [facility] to send [facility] referral for a pump to [facility] office. [facility] sent referral and confirmed it went through.  LC reviewed feeding infant on cue 8-12x in 24 hrs, not allowing infant to go over 3 hrs without a feeding, CDC milk storage guidelines and LC services handout. LC encouraged [name] to call lactation for further assistance as needed.  Maternal Data Does the patient have breastfeeding experience prior to this delivery?: Yes How long did the patient breastfeed?: 2 years each for two of her most recent children  Feeding Mother's Current Feeding Choice: Breast Milk  LATCH Score Latch: Grasps breast easily, tongue down, lips flanged, rhythmical sucking.  Audible Swallowing: A few with stimulation  Type of Nipple: Everted at rest and after stimulation  Comfort (Breast/Nipple): Soft / non-tender  Hold (Positioning): No assistance needed to correctly position infant at breast.  LATCH Score: 9   Lactation Tools Discussed/Used Pump Education: Milk Storage  Interventions Interventions: Breast feeding basics reviewed;Education;LC Services brochure  Discharge Discharge Education: Warning signs for feeding baby Pump:  (Sent referral to [facility] for pump) WIC Program: Yes  Consult Status Consult Status: Follow-up Date: 05/23/23 Follow-up type: In-patient    Dema Severin BS, IBCLC 05/22/2023, 6:23 PM

## 2023-05-22 NOTE — H&P (Signed)
LABOR AND DELIVERY ADMISSION HISTORY AND PHYSICAL NOTE  Stacy Wall is a 32 y.o. female [phone] with IUP at [redacted]w[redacted]d by 28 week U/S presenting for IOL for post-dates. 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  Patient reports the fetal movement as active. Patient reports uterine contraction  activity as irregular. Mostly at night. Patient reports  vaginal bleeding as none. Patient describes fluid per vagina as None.   Patient denies headache, vision changes, chest pain, shortness of breath, right upper quadrant pain, or LE edema.  She plans on breast feeding feeding. Her contraception plan is:  Nexplanon .  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    Past Medical History: Past Medical History:  Diagnosis Date   Chronic back pain    Gallstones    Herpes    Medical history non-contributory     Past Surgical History: Past Surgical History:  Procedure Laterality Date   CHOLECYSTECTOMY      Obstetrical History: OB History     Gravida  5   Para  4   Term  4   Preterm      AB      Living  4      SAB      IAB      Ectopic      Multiple  0   Live Births  4           Social History: Social History   Socioeconomic History   Marital status: Married    Spouse name: Engineer, petroleum   Number of children: 2   Years of education: Not on file   Highest education level: 10th grade  Occupational History   Not on file  Tobacco Use   Smoking status: Never   Smokeless tobacco: Never  Vaping Use    Vaping status: Never Used  Substance and Sexual Activity   Alcohol use: No   Drug use: No   Sexual activity: Yes    Birth control/protection: None  Other Topics Concern   Not on file  Social History Narrative   Not on file   Social Determinants of Health   Financial Resource Strain: Low Risk  (04/28/2018)   Overall Financial Resource Strain (CARDIA)    Difficulty of Paying Living Expenses: Not hard at all  Food Insecurity: No Food Insecurity (04/28/2018)   Hunger Vital Sign    Worried About Running Out of Food in the Last Year: Never true    Ran Out of Food in the Last Year: Never true  Transportation Needs: No Transportation Needs (04/28/2018)   PRAPARE - Administrator, Civil Service (Medical): No    Lack of Transportation (Non-Medical): No  Physical Activity: Insufficiently Active (04/28/2018)   Exercise Vital Sign    Days of Exercise per Week: 2 days    Minutes of Exercise per Session: 30 min  Stress: No Stress Concern Present (04/28/2018)   [employer] of Occupational Health - Occupational Stress Questionnaire    Feeling of Stress : Not at all  Social Connections: Somewhat Isolated (04/28/2018)  Social Advertising account executive [NHANES]    Frequency of Communication with Friends and Family: More than three times a week    Frequency of Social Gatherings with Friends and Family: Once a week    Attends Religious Services: Never    Database administrator or Organizations: No    Attends Engineer, structural: Never    Marital Status: Living with partner    Family History: Family History  Problem Relation Age of Onset   Healthy Mother    Diabetes Maternal Aunt    Cancer Maternal Grandmother        breast, ovarian   Cancer Maternal Grandfather        colon    Allergies: No Known Allergies  Medications Prior to Admission  Medication Sig Dispense Refill Last Dose   Prenatal Vit-Fe Fumarate-FA (PRENATAL VITAMIN PO) Take by mouth.       valACYclovir (VALTREX) 500 MG tablet Take 500 mg by mouth 2 (two) times daily.        Review of Systems  All systems reviewed and negative except as stated in HPI  Physical Exam BP 121/81   Pulse 67   Temp 98 F (36.7 C) (Oral)   Ht 5\' 5"  (1.651 m)   Wt 84.2 kg   BMI 30.90 kg/m   Physical Exam Constitutional:      General: She is not in acute distress.    Appearance: Normal appearance. She is not ill-appearing.  Cardiovascular:     Rate and Rhythm: Normal rate and regular rhythm.  Pulmonary:     Effort: Pulmonary effort is normal.  Abdominal:     Comments: Gravid  Musculoskeletal:        General: No swelling.  Skin:    General: Skin is warm and dry.  Neurological:     General: No focal deficit present.  Psychiatric:        Mood and Affect: Mood normal.   Presentation: cephalic by check  Fetal monitoring: Baseline: 135 bpm, Variability: Good {> 6 bpm), Accelerations: Reactive, and Decelerations: Absent Uterine activity: rare/irregular  Dilation: 1.5 Effacement (%): 50 Station: -3 Exam by:: Earlene Plater MD  Prenatal labs: ABO, Rh: --/--/PENDING (10/24 0050) Antibody: PENDING (10/24 0050) Rubella:   RPR:    HBsAg:    HIV:    GC/Chlamydia:  Neisseria Gonorrhea  Date Value Ref Range Status  05/15/2023 Negative  Final   Chlamydia  Date Value Ref Range Status  05/15/2023 Negative  Final   GBS: Negative/-- (10/17 1200)    Prenatal Transfer Tool  Maternal Diabetes: No Genetic Screening: Declined Maternal Ultrasounds/Referrals: Normal Fetal Ultrasounds or other Referrals:  None Maternal Substance Abuse:  No Significant Maternal Medications:  Valtrex Significant Maternal Lab Results: None  Results for orders placed or performed during the hospital encounter of 05/22/23 (from the past 24 hour(s))  Type and screen   Collection Time: 05/22/23 12:50 AM  Result Value Ref Range   ABO/RH(D) PENDING    Antibody Screen PENDING    Sample Expiration       05/25/2023,2359 Performed at [facility], [address], [place], [place] [address]     Assessment: Stacy Wall is a 32 y.o. G5P4004 at [redacted]w[redacted]d here for IOL for post-dates  #Labor: Dual cytotec #Pain: IV pain meds PRN, epidural upon request #FHT: Category I #GBS/ID: Negative #MOF: breast feeding #MOC:  Nexplanon #Circ: Yes  #Late to [facility]: Established at 40 wks. Do have dating based upon 28 wk U/S.  Do not have all prenatal labs available. Will draw at admission #Hx HSV: on valtrex since 37 weeks. Has not missed doses. Denies any sign of outbreak and has no lesions on external genitalia  Joanne Gavel, MD [facility] Fellow Center for [facility], [facility]  05/22/2023, 1:27 AM

## 2023-05-22 NOTE — Discharge Summary (Signed)
Postpartum Discharge Summary   Patient Name: Stacy Wall DOB: 12-Aug-1990 MRN: [id number]  Date of admission: 05/22/2023 Delivery date:05/22/2023 Delivering provider: Federico Flake Date of discharge: 05/23/2023  Admitting diagnosis: Post-dates pregnancy [O48.0] Intrauterine pregnancy: [redacted]w[redacted]d     Secondary diagnosis:  Principal Problem:   Breech delivery Active Problems:   HSV-2 (herpes simplex virus 2) infection   Encounter for supervision of normal pregnancy, antepartum   Late prenatal care   Post-dates pregnancy  Additional problems: None    Discharge diagnosis: Term Pregnancy Delivered                                              Post partum procedures: none Augmentation: Cytotec, [facility], Pitocin Complications: None  Hospital course: Onset of Labor With Vaginal Delivery      32 y.o. yo G5P5005 at [redacted]w[redacted]d was admitted for IOL for postdates on 05/22/2023. Labor course was complicated by none.  Membrane Rupture Time/Date: 11:45 AM,05/22/2023  Delivery Method:Vaginal, Breech Operative Delivery:N/A Episiotomy: None Lacerations:  None Patient had a postpartum course complicated by none.  She is ambulating, tolerating a regular diet, passing flatus, and urinating well. Patient is discharged home in stable condition on 05/23/23.  Newborn Data: Birth date:05/22/2023 Birth time:1:49 PM Gender:Female Living status:Living Apgars:8 ,9  Weight:3380 g  Magnesium Sulfate received: No BMZ received: No Rhophylac:N/A MMR:N/A T-DaP:Given prenatally Flu: No RSV Vaccine received: No Transfusion:No Immunizations administered: There is no immunization history for the selected administration types on file for this patient.  Physical exam  Vitals:   05/22/23 1727 05/22/23 2130 05/23/23 0132 05/23/23 0543  BP: 112/76 122/73 105/70 100/66  Pulse: 69 66 69 68  Resp: 17 16 18 16   Temp: 98.2 F (36.8 C) 98.4 F (36.9 C) 98.7 F (37.1 C) (!) 97.5 F (36.4 C)  TempSrc:   Oral Oral Oral  SpO2: 99% 99% 100% 100%  Weight:      Height:       General: alert, cooperative, and no distress Lochia: appropriate Uterine Fundus: firm Incision: N/A DVT Evaluation: No evidence of DVT seen on physical exam.  Labs: Lab Results  Component Value Date   WBC 11.5 (H) 05/23/2023   HGB 10.2 (L) 05/23/2023   HCT 30.7 (L) 05/23/2023   MCV 89.2 05/23/2023   PLT 188 05/23/2023      Latest Ref Rng & Units 03/27/2021   11:42 AM  CMP  Glucose 70 - 99 mg/dL 90   BUN 6 - 20 mg/dL 15   Creatinine 6.21 - 1.00 mg/dL 3.08   Sodium 657 - 846 mmol/L 138   Potassium 3.5 - 5.1 mmol/L 4.0   Chloride 98 - 111 mmol/L 105   CO2 22 - 32 mmol/L 23   Calcium 8.9 - 10.3 mg/dL 9.1   Total Protein 6.5 - 8.1 g/dL 8.1   Total Bilirubin 0.3 - 1.2 mg/dL 0.7   Alkaline Phos 38 - 126 U/L 87   AST 15 - 41 U/L 20   ALT 0 - 44 U/L 19    Edinburgh Score:    11/21/2020    7:29 PM  Edinburgh Postnatal Depression Scale Screening Tool  I have been able to laugh and see the funny side of things. 0  I have looked forward with enjoyment to things. 0  I have blamed myself unnecessarily when things went wrong. 0  I have been anxious or worried for no good reason. 0  I have felt scared or panicky for no good reason. 0  Things have been getting on top of me. 0  I have been so unhappy that I have had difficulty sleeping. 0  I have felt sad or miserable. 0  I have been so unhappy that I have been crying. 0  The thought of harming myself has occurred to me. 0  Edinburgh Postnatal Depression Scale Total 0      After visit meds:  Allergies as of 05/23/2023   No Known Allergies      Medication List     STOP taking these medications    valACYclovir 500 MG tablet Commonly known as: VALTREX       TAKE these medications    acetaminophen 500 MG tablet Commonly known as: TYLENOL Take 2 tablets (1,000 mg total) by mouth every 8 (eight) hours as needed (for pain scale < 4).   ibuprofen  800 MG tablet Commonly known as: ADVIL Take 1 tablet (800 mg total) by mouth every 8 (eight) hours as needed for mild pain (pain score 1-3).   PRENATAL VITAMIN PO Take by mouth.         Discharge home in stable condition Infant Feeding: Breast Infant Disposition:home with mother Discharge instruction: per After Visit Summary and Postpartum booklet. Activity: Advance as tolerated. Pelvic rest for 6 weeks.  Diet: routine diet Anticipated Birth Control:  Nexplanon OP Postpartum Appointment:4 weeks Additional Postpartum F/U:  none Future Appointments:No future appointments.  Follow up Visit:  Message sent to [facility] 10/25  05/23/2023 Joanne Gavel, MD OB Fellow

## 2023-05-22 NOTE — Anesthesia Procedure Notes (Signed)
Epidural Patient location during procedure: OB Start time: 05/22/2023 9:50 AM End time: 05/22/2023 9:57 AM  Staffing Anesthesiologist: Collene Schlichter, MD Performed: anesthesiologist   Preanesthetic Checklist Completed: patient identified, IV checked, risks and benefits discussed, monitors and equipment checked, pre-op evaluation and timeout performed  Epidural Patient position: sitting Prep: DuraPrep Patient monitoring: blood pressure and continuous pulse ox Approach: midline Location: L3-L4 Injection technique: LOR air  Needle:  Needle type: Tuohy  Needle gauge: 17 G Needle length: 9 cm Needle insertion depth: 6 cm Catheter size: 19 Gauge Catheter at skin depth: 11 cm Test dose: negative and Other (1% Lidocaine)  Additional Notes Patient identified.  Risk benefits discussed including failed block, incomplete pain control, headache, nerve damage, paralysis, blood pressure changes, nausea, vomiting, reactions to medication both toxic or allergic, and postpartum back pain.  Patient expressed understanding and wished to proceed.  All questions were answered.  Sterile technique used throughout procedure and epidural site dressed with sterile barrier dressing. No paresthesia or other complications noted. The patient did not experience any signs of intravascular injection such as tinnitus or metallic taste in mouth nor signs of intrathecal spread such as rapid motor block. Please see nursing notes for vital signs. Reason for block:procedure for pain

## 2023-05-22 NOTE — Anesthesia Preprocedure Evaluation (Addendum)
Anesthesia Evaluation  Patient identified by MRN, date of birth, ID band Patient awake    Reviewed: Allergy & Precautions, NPO status , Patient's Chart, lab work & pertinent test results  Airway Mallampati: II  TM Distance: >3 FB Neck ROM: Full    Dental  (+) Teeth Intact, Dental Advisory Given   Pulmonary neg pulmonary ROS   Pulmonary exam normal breath sounds clear to auscultation       Cardiovascular negative cardio ROS Normal cardiovascular exam Rhythm:Regular Rate:Normal     Neuro/Psych negative neurological ROS  negative psych ROS   GI/Hepatic negative GI ROS, Neg liver ROS,,,  Endo/Other  negative endocrine ROS  Obesity   Renal/GU negative Renal ROS     Musculoskeletal negative musculoskeletal ROS (+)    Abdominal   Peds  Hematology negative hematology ROS (+) Plt 219k   Anesthesia Other Findings Day of surgery medications reviewed with the patient.  Reproductive/Obstetrics                             Anesthesia Physical Anesthesia Plan  ASA: 2  Anesthesia Plan: Epidural   Post-op Pain Management:    Induction:   PONV Risk Score and Plan: 2 and Treatment may vary due to age or medical condition  Airway Management Planned: Natural Airway  Additional Equipment:   Intra-op Plan:   Post-operative Plan:   Informed Consent: I have reviewed the patients History and Physical, chart, labs and discussed the procedure including the risks, benefits and alternatives for the proposed anesthesia with the patient or authorized representative who has indicated his/her understanding and acceptance.     Dental advisory given  Plan Discussed with:   Anesthesia Plan Comments: (Patient identified. Risks/Benefits/Options discussed with patient including but not limited to bleeding, infection, nerve damage, paralysis, failed block, incomplete pain control, headache, blood pressure  changes, nausea, vomiting, reactions to medication both or allergic, itching and postpartum back pain. Confirmed with bedside nurse the patient's most recent platelet count. Confirmed with patient that they are not currently taking any anticoagulation, have any bleeding history or any family history of bleeding disorders. Patient expressed understanding and wished to proceed. All questions were answered. )       Anesthesia Quick Evaluation

## 2023-05-22 NOTE — Progress Notes (Addendum)
Breech Delivery Consent: Stacy Wall is 32 y.o. G5P4004 on LD for IOL due to postdates and was induced. Vertex was felt by fellow at [facility] but then started having frank meconium per vagina. BSUS and my personal vaginal exam proved patient to be breech.   I discussed the options for management including: 1) vaginal breech 2) primary c-section  Patient has had 4 prior vaginal deliveries with largest infant weighing 3260g.   We reviewed the risks associated with breech delivery, most importantly fetal head entrapment. We discussed how head entrapment is alleviated with typical maneuvers, cervical laceration, and pipers forceps. We discussed the risks associated with head entrapment and the rare for present need for emergent CS if fetal head was unable to be delivered.    The risks of cesarean section were discussed with the patient including but were not limited to: bleeding which may require transfusion or reoperation; infection which may require antibiotics; injury to bowel, bladder, ureters or other surrounding organs; injury to the fetus; need for additional procedures including hysterectomy in the event of a life-threatening hemorrhage; placental abnormalities wth subsequent pregnancies, incisional problems, thromboembolic phenomenon and other postoperative/anesthesia complications.    Patient and her partner were given the opportunity to ask questions. FOB did ask about risk of infant death due to entrapment. We discussed that any delivery has this risk and that we would exhaust many measures to prevent this. They were both reassured.   RN Stacy Wall and Stacy Wall witnessed consent.   Patient expressed "I just want to have this baby naturally"  Patient agreed to vaginal breech delivery and signed consent.  I have called in my back up Dr. Tinnie Wall.   Stacy Flake, MD, MPH, ABFM, [facility] Attending Physician Center for [facility]

## 2023-05-23 ENCOUNTER — Other Ambulatory Visit (HOSPITAL_COMMUNITY): Payer: Self-pay

## 2023-05-23 LAB — CBC
HCT: 30.7 % — ABNORMAL LOW (ref 36.0–46.0)
Hemoglobin: 10.2 g/dL — ABNORMAL LOW (ref 12.0–15.0)
MCH: 29.7 pg (ref 26.0–34.0)
MCHC: 33.2 g/dL (ref 30.0–36.0)
MCV: 89.2 fL (ref 80.0–100.0)
Platelets: 188 10*3/uL (ref 150–400)
RBC: 3.44 MIL/uL — ABNORMAL LOW (ref 3.87–5.11)
RDW: 13.3 % (ref 11.5–15.5)
WBC: 11.5 10*3/uL — ABNORMAL HIGH (ref 4.0–10.5)
nRBC: 0 % (ref 0.0–0.2)

## 2023-05-23 MED ORDER — IBUPROFEN 800 MG PO TABS
800.0000 mg | ORAL_TABLET | Freq: Three times a day (TID) | ORAL | 0 refills | Status: DC | PRN
  Filled 2023-05-23: qty 30, 10d supply, fill #0

## 2023-05-23 MED ORDER — ACETAMINOPHEN 500 MG PO TABS
1000.0000 mg | ORAL_TABLET | Freq: Three times a day (TID) | ORAL | 0 refills | Status: DC | PRN
  Filled 2023-05-23: qty 30, 5d supply, fill #0

## 2023-05-23 NOTE — Clinical Social Work Maternal (Signed)
CLINICAL SOCIAL WORK MATERNAL/CHILD NOTE   Patient Details  Name: Stacy Wall MRN: [id number] Date of Birth: 10/26/1990   Date:  2022-08-08   Clinical Social Worker Initiating Note:  Willaim Rayas Reeder Brisby      Date/Time: Initiated:  05/23/23/1351      Child's Name:  Travon Guandique May 25, 2023    Biological Parents:  Mother, Father Stacy Wall 02/27/1993, Ellwood Handler 05/05/1999)    Need for Interpreter:  None    Reason for Referral:  Late or No Prenatal Care      Address:  [address] [place] [address]    Phone number:  [phone] (home)      Additional phone number:    Household Members/Support Persons (HM/SP):   Household Member/Support Person 1, Household Member/Support Person 2, Household Member/Support Person 3, Household Member/Support Person 4     HM/SP Name Relationship DOB or Age  HM/SP -1 Tevin Reynolds son 08/19/2009  HM/SP -2 Jovita Gamma Reynolds daughter 12/30/2010  HM/SP -3 Polk Phong daughter 08/03/2018  HM/SP -4 Camila Hedman daughter 11/21/2020  HM/SP -5     HM/SP -6     HM/SP -7     HM/SP -8         Natural Supports (not living in the home):  Parent    Professional Supports: None    Employment: Unemployed    Type of Work:      Education:  9 to 11 years    Homebound arranged: No   Financial Resources:       Other Resources:  Sales executive  , [facility]    Cultural/Religious Considerations Which May Impact Care:     Strengths:  Ability to meet basic needs  , Home prepared for child  , Pediatrician chosen    Psychotropic Medications:          Pediatrician:    [facility]   Pediatrician List:    [facility] [place]   [facility]  [facility]       Pediatrician Fax Number:     Risk Factors/Current Problems:  None    Cognitive State:  Able to Concentrate  , Alert      Mood/Affect:  Calm  , Comfortable      CSW Assessment: CSW received a consult for  limited prenatal care. CSW met with [name] to complete assessment and offer support. CSW entered the room and observed [name] at bedside with the infant. CSW introduced self, CSW role and reason for visit, [name] was agreeable to assessment. CSW inquired about how [name] was feeling, [name] reported good. CSW confirmed [name]'s address and phone number, [name] verified the address and phone number on file were correct.    CSW inquired about [name] MH hx, [name] denied any MH concerns. CSW provided education regarding the baby blues period vs. perinatal mood disorders, discussed treatment and gave resources for mental health follow up if concerns arise.  CSW recommends self-evaluation during the postpartum time period using the New Mom Checklist from Postpartum Progress and encouraged [name] to contact a medical professional if symptoms are noted at any time.  [name] identified her mom and FOB as her supports.    CSW inquired about [name]'s lack of prenatal care, [name] reported she was unable to get insurance. [name] denied transportation barriers. CSW explained the hospital drug screen policy due to lack of prenatal care, [name] verbalized understanding. CSW informed [name] infant UDS was negative, the CDS  was pending and a CPS reported would  made if warranted, [name] verbalize understanding. [name] denied substance use during pregnancy.  CSW provided review of Sudden Infant Death Syndrome (SIDS) precautions.  [name] reported she has all necessary items for the infant including a bassinet, crib and car seat.  CSW identifies no further need for intervention and no barriers to discharge at this time.   CSW Plan/Description:  No Further Intervention Required/No Barriers to Discharge, Sudden Infant Death Syndrome (SIDS) Education, Perinatal Mood and Anxiety Disorder (PMADs) Education, Hospital Drug Screen Policy Information, CSW Will Continue to Monitor Umbilical Cord Tissue Drug Screen Results and Make Report if Sandy Salaam, LCSW Aug 10, 2022,  2:03 PM

## 2023-05-23 NOTE — Lactation Note (Signed)
This note was copied from a baby's chart. Lactation Consultation Note  Patient Name: Stacy Wall [id number] Date: 05/23/2023 Age:30 hours Reason for consult: Follow-up assessment;Term  P5- Per [name]/FOB, feeding have been going well and they express no concerns. LC informed [name]/FOB that the pump referral to  [facility] went through and they should expect a call within the next couple of days. LC reviewed CDC milk storage guidelines, LC services handout and engorgement/breast care. LC encouraged [name]/FOB to call for further assistance as needed.  Maternal Data Does the patient have breastfeeding experience prior to this delivery?: Yes How long did the patient breastfeed?: 2 years per child  Feeding Mother's Current Feeding Choice: Breast Milk  LATCH Score Latch: Grasps breast easily, tongue down, lips flanged, rhythmical sucking.  Audible Swallowing: A few with stimulation  Type of Nipple: Everted at rest and after stimulation  Comfort (Breast/Nipple): Soft / non-tender  Hold (Positioning): No assistance needed to correctly position infant at breast.  LATCH Score: 9   Lactation Tools Discussed/Used Pump Education: Milk Storage  Interventions Interventions: Breast feeding basics reviewed;Education;LC Services brochure  Discharge Discharge Education: Engorgement and breast care;Warning signs for feeding baby WIC Program: Yes  Consult Status Consult Status: Complete Date: 05/23/23    Stacy Wall BS, IBCLC 05/23/2023, 9:53 AM

## 2023-05-23 NOTE — Anesthesia Postprocedure Evaluation (Signed)
Anesthesia Post Note  Patient: Stacy Wall  Procedure(s) Performed: AN AD HOC LABOR EPIDURAL     Patient location during evaluation: Mother Baby Anesthesia Type: Epidural Level of consciousness: awake, oriented and awake and alert Pain management: pain level controlled Vital Signs Assessment: post-procedure vital signs reviewed and stable Respiratory status: spontaneous breathing, respiratory function stable and nonlabored ventilation Cardiovascular status: stable Postop Assessment: no headache, adequate PO intake, able to ambulate, patient able to bend at knees and no apparent nausea or vomiting Anesthetic complications: no   No notable events documented.  Last Vitals:  Vitals:   05/23/23 0132 05/23/23 0543  BP: 105/70 100/66  Pulse: 69 68  Resp: 18 16  Temp: 37.1 C (!) 36.4 C  SpO2: 100% 100%    Last Pain:  Vitals:   05/23/23 0800  TempSrc:   PainSc: 0-No pain   Pain Goal:                   Latroya Ng

## 2023-05-24 LAB — RUBELLA SCREEN: Rubella: 2.47 {index} (ref >0.99)

## 2023-06-10 ENCOUNTER — Telehealth (HOSPITAL_COMMUNITY): Payer: Self-pay | Admitting: *Deleted

## 2023-06-10 NOTE — Telephone Encounter (Signed)
06/10/2023  Name: Stacy Wall MRN: [id number] DOB: 1990/08/24  Reason for Call:  Transition of Care Hospital Discharge Call  Contact Status: Patient Contact Status: Complete  Language assistant needed: Interpreter Mode: Interpreter Not Needed        Follow-Up Questions: Do You Have Any Concerns About Your Health As You Heal From Delivery?: No Do You Have Any Concerns About Your Infants Health?: No  Edinburgh Postnatal Depression Scale:  In the Past 7 Days:    PHQ2-9 Depression Scale:     Discharge Follow-up: Edinburgh score requires follow up?:  (declined screening today, reports she completed screening at [facility] office- no concerns raised, she endorses she is doing well emotionally) Patient was advised of the following resources:: Breastfeeding Support Group, Support Group (declines postpartum group information via email)  Post-discharge interventions: Reviewed Newborn Safe Sleep Practices  Salena Saner, RN 06/10/2023 15:31

## 2023-07-03 ENCOUNTER — Ambulatory Visit: Payer: Self-pay | Admitting: Women's Health

## 2024-04-14 ENCOUNTER — Other Ambulatory Visit: Payer: Self-pay

## 2024-04-14 ENCOUNTER — Emergency Department (HOSPITAL_COMMUNITY): Admission: EM | Admit: 2024-04-14 | Discharge: 2024-04-14 | Discharge status: Against Medical Advice | Payer: Self-pay

## 2024-04-14 DIAGNOSIS — Z5321 Procedure and treatment not carried out due to patient leaving prior to being seen by health care provider: Secondary | ICD-10-CM | POA: Insufficient documentation

## 2024-04-14 DIAGNOSIS — M79672 Pain in left foot: Secondary | ICD-10-CM | POA: Insufficient documentation

## 2024-04-14 MED ORDER — ACETAMINOPHEN 325 MG PO TABS
650.0000 mg | ORAL_TABLET | Freq: Four times a day (QID) | ORAL | Status: AC | PRN
  Administered 2024-04-14: 650 mg via ORAL
  Filled 2024-04-14: qty 2

## 2024-04-14 NOTE — ED Notes (Signed)
 Pt walked out of room and began walking to the door. This nurse asked what she was doing to which she responded that she was leaving due to waiting too long for the provider.

## 2024-04-14 NOTE — ED Triage Notes (Signed)
 Pt was crouched down showering her child yesterday night when she began to have left plantar foot pain. Denies any other injury. Is ambulatory in triage. No difficulty placing pressure on foot.

## 2024-04-16 ENCOUNTER — Emergency Department (HOSPITAL_COMMUNITY): Payer: MEDICAID

## 2024-04-16 ENCOUNTER — Other Ambulatory Visit: Payer: Self-pay

## 2024-04-16 ENCOUNTER — Emergency Department (HOSPITAL_COMMUNITY)
Admission: EM | Admit: 2024-04-16 | Discharge: 2024-04-16 | Disposition: A | Discharge status: Home / Self Care | Payer: MEDICAID | Attending: Emergency Medicine | Admitting: Emergency Medicine

## 2024-04-16 ENCOUNTER — Encounter (HOSPITAL_COMMUNITY): Payer: Self-pay

## 2024-04-16 DIAGNOSIS — X58XXXA Exposure to other specified factors, initial encounter: Secondary | ICD-10-CM | POA: Insufficient documentation

## 2024-04-16 DIAGNOSIS — S93602A Unspecified sprain of left foot, initial encounter: Secondary | ICD-10-CM | POA: Insufficient documentation

## 2024-04-16 NOTE — ED Triage Notes (Signed)
 Pt in by POV. C/O of left foot pain that started this morning. Pt denies any injury.

## 2024-04-16 NOTE — ED Provider Notes (Signed)
 [place] EMERGENCY DEPARTMENT AT [facility] Provider Note   CSN: [id number] Arrival date & time: 04/16/24  9251     Patient presents with: Foot Pain   Stacy Wall is a 33 y.o. female.   Patient has pain in her left foot while standing in an unusual fashion.  She did not fall  The history is provided by the patient and medical records. No language interpreter was used.  Foot Pain This is a new problem. The current episode started 12 to 24 hours ago. The problem occurs constantly. The problem has not changed since onset.Pertinent negatives include no chest pain, no abdominal pain and no headaches. Nothing aggravates the symptoms. Nothing relieves the symptoms. She has tried nothing for the symptoms.       Prior to Admission medications   Medication Sig Start Date End Date Taking? Authorizing Provider  acetaminophen  (TYLENOL ) 500 MG tablet Take 2 tablets (1,000 mg total) by mouth every 8 (eight) hours as needed (for pain scale < 4). 05/23/23   Nicholaus Almarie HERO, MD  ibuprofen  (ADVIL ) 800 MG tablet Take 1 tablet (800 mg total) by mouth every 8 (eight) hours as needed for mild pain (pain score 1-3). 05/23/23   Nicholaus Almarie HERO, MD  Prenatal Vit-Fe Fumarate-FA (PRENATAL VITAMIN PO) Take by mouth.    [provider]    Allergies: Patient has no known allergies.    Review of Systems  Constitutional:  Negative for appetite change and fatigue.  HENT:  Negative for congestion, ear discharge and sinus pressure.   Eyes:  Negative for discharge.  Respiratory:  Negative for cough.   Cardiovascular:  Negative for chest pain.  Gastrointestinal:  Negative for abdominal pain and diarrhea.  Genitourinary:  Negative for frequency and hematuria.  Musculoskeletal:  Negative for back pain.       Left foot pain  Skin:  Negative for rash.  Neurological:  Negative for seizures and headaches.  Psychiatric/Behavioral:  Negative for hallucinations.     Updated Vital  Signs BP 115/84   Pulse 66   Temp 97.6 F (36.4 C) (Oral)   Resp 16   Ht 5' 5 (1.651 m)   Wt 85 kg   SpO2 100%   BMI 31.18 kg/m   Physical Exam Vitals and nursing note reviewed.  Constitutional:      Appearance: She is well-developed.  HENT:     Head: Normocephalic.     Nose: Nose normal.  Eyes:     General: No scleral icterus.    Conjunctiva/sclera: Conjunctivae normal.  Neck:     Thyroid: No thyromegaly.  Cardiovascular:     Rate and Rhythm: Normal rate and regular rhythm.     Heart sounds: No murmur heard.    No friction rub. No gallop.  Pulmonary:     Breath sounds: No stridor. No wheezing or rales.  Chest:     Chest wall: No tenderness.  Abdominal:     General: There is no distension.     Tenderness: There is no abdominal tenderness. There is no rebound.  Musculoskeletal:        General: Normal range of motion.     Cervical back: Neck supple.     Comments: Pain in left foot  Lymphadenopathy:     Cervical: No cervical adenopathy.  Skin:    Findings: No erythema or rash.  Neurological:     Mental Status: She is alert and oriented to person, place, and time.  Motor: No abnormal muscle tone.     Coordination: Coordination normal.  Psychiatric:        Behavior: Behavior normal.     (all labs ordered are listed, but only abnormal results are displayed) Labs Reviewed - No data to display  EKG: None  Radiology: DG Foot Complete Left Result Date: 04/16/2024 CLINICAL DATA:  Acute left foot pain without known injury. EXAM: LEFT FOOT - COMPLETE 3+ VIEW COMPARISON:  None Available. FINDINGS: There is no evidence of fracture or dislocation. There is no evidence of arthropathy or other focal bone abnormality. Soft tissues are unremarkable. IMPRESSION: Negative. Electronically Signed   By: Lynwood Landy Raddle M.D.   On: 04/16/2024 09:20     Procedures   Medications Ordered in the ED - No data to display                                  Medical Decision  Making Amount and/or Complexity of Data Reviewed Radiology: ordered.   Sprain left foot.  She is going to take Tylenol .  Patient is breast-feeding and cannot take anything else.  She will follow-up with a podiatrist if not improving     Final diagnoses:  Sprain of left foot, initial encounter    ED Discharge Orders     None          Suzette Pac, MD 04/21/24 1037

## 2024-04-16 NOTE — Discharge Instructions (Addendum)
 Take Tylenol  for pain.  Try to stay off foot is much as possible.  Follow-up with either your family doctor or the foot doctor, Dr. Blinda in 2 to 3 weeks

## 2024-05-17 ENCOUNTER — Ambulatory Visit: Payer: Self-pay | Admitting: Physician Assistant

## 2024-05-17 ENCOUNTER — Encounter: Payer: Self-pay | Admitting: Physician Assistant

## 2024-05-17 VITALS — BP 122/78 | HR 75 | Temp 97.0°F

## 2024-05-17 DIAGNOSIS — B353 Tinea pedis: Secondary | ICD-10-CM

## 2024-05-17 DIAGNOSIS — M7741 Metatarsalgia, right foot: Secondary | ICD-10-CM

## 2024-05-17 DIAGNOSIS — Z3009 Encounter for other general counseling and advice on contraception: Secondary | ICD-10-CM

## 2024-05-17 DIAGNOSIS — Z7689 Persons encountering health services in other specified circumstances: Secondary | ICD-10-CM

## 2024-05-17 MED ORDER — CLOTRIMAZOLE-BETAMETHASONE 1-0.05 % EX CREA: 1.0000 | TOPICAL_CREAM | Freq: Every day | CUTANEOUS | Status: AC

## 2024-05-17 NOTE — Progress Notes (Signed)
 BP 122/78   Pulse 75   Temp (!) 97 F (36.1 C)   SpO2 98%    Subjective:    Patient ID: Stacy Wall, female    DOB: [birth year], 33 y.o.   MRN: [id number]  HPI: Stacy Wall is a 33 y.o. female presenting on 05/17/2024 for New Patient (Initial Visit)   HPI  Pt is 32yoF who is in today to establish care.     She has five children.  She is still breastfeeding her one year old and says she plans to continue this.  She is using breastfeeding as her contraception.  She says she was on depo in April from [facility] and just didn't go back to get her next dose.  she was seen at [facility] last Monday for breast pain.   She does not work.  She [place]oved to [place]  from [place] age [age]  Her feet are bothering her.   It Started when bathing her children and bending her feet while on her knees.  It started in the left foot first and now is hurting B.  It started about two weeks before she was seen in ED for this (on 04/16/24)   .   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        Relevant past medical, surgical, family and social history reviewed and updated as indicated. Interim medical history since our last visit reviewed. Allergies and medications reviewed and updated.  Review of Systems  Per HPI unless specifically indicated above     Objective:    BP 122/78   Pulse 75   Temp (!) 97 F (36.1 C)   SpO2 98%   Wt Readings from Last 3 Encounters:  04/16/24 187 lb 6.3 oz (85 kg)  05/22/23 185 lb 11.2 oz (84.2 kg)  05/15/23 186 lb (84.4 kg)    Physical Exam Constitutional:      General: She is not in acute distress.    Appearance: She is not toxic-appearing.  HENT:     Head: Normocephalic and atraumatic.  Cardiovascular:     Rate and Rhythm: Normal rate and regular rhythm.     Pulses:          Dorsalis pedis pulses are 2+ on the right side and 2+ on the left side.       Posterior tibial pulses are 2+ on the right side and 2+ on the  left side.  Pulmonary:     Effort: Pulmonary effort is normal. No respiratory distress.     Breath sounds: Normal breath sounds. No wheezing, rhonchi or rales.  Musculoskeletal:     Right lower leg: No edema.     Left lower leg: No edema.     Right foot: Normal range of motion. No deformity.     Left foot: Normal range of motion. No deformity.  Feet:     Comments: Fungal disease around toes B.  Feet NT Neurological:     Mental Status: She is alert and oriented to person, place, and time.  Psychiatric:        Behavior: Behavior is cooperative.            Assessment & Plan:    Encounter Diagnoses  Name Primary?   Encounter to establish care Yes   Metatarsalgia of both feet    Tinea pedis of both feet    Birth control counseling  Foot pain- -discussed with pt need for conservative treatment prior to referrals to specialists and MRIs -pt counseled to Wear sneakers with good support and avoid walking in barefeet and flipflops -apply ice 10-15 minutes two-three times daily -discussed that most benefit would come with prescription of Nsaids but this is deferred due to continuing to breastfeed (which she doesn't want to stop at this time) -will recheck in one month  Fungus -pt counseled and given sample lotrisone  Contraceptive management -discussed with pt that breastfeeding is not a reliable contraceptive, particularly with the child turning [age] this week.  Discussed that depo could be administered today or other options available but pt was not interested at this time  (H&P limited due to 33yo & 33yo with her)

## 2024-05-17 NOTE — Patient Instructions (Signed)
 Athlete's Foot Athlete's foot (tinea pedis) is a fungal infection of the skin on your feet. It often occurs on the skin that is between or underneath the toes. It can also occur on the soles of your feet. The infection can spread from person to person (is contagious). It can also spread when a person's bare feet come in contact with the fungus on shower floors or on items such as shoes. What are the causes? This condition is caused by a fungus that grows in warm, moist places. You can get athlete's foot by sharing shoes, shower stalls, towels, and wet floors with someone who is infected. Not washing your feet or changing your socks often enough can also lead to athlete's foot. What increases the risk? This condition is more likely to develop in: Men. People who have a weak body defense system (immune system). People who have diabetes. People who use public showers, such as at a gym. People who wear heavy-duty shoes, such as Youth worker. Seasons with warm, humid weather. What are the signs or symptoms? Symptoms of this condition include: Itchy areas between your toes or on the soles of your feet. White, flaky, or scaly areas between your toes or on the soles of your feet. Very itchy small blisters between your toes or on the soles of your feet. Small cuts in your skin. These cuts can become infected. Thick or discolored toenails. How is this diagnosed? This condition may be diagnosed with a physical exam and a review of your medical history. Your health care provider may also take a skin or toenail sample to examine under a microscope. How is this treated? This condition is treated with antifungal medicines. These may be applied as powders, ointments, or creams. In severe cases, an oral antifungal medicine may be given. Follow these instructions at home: Medicines Apply or take over-the-counter and prescription medicines only as told by your health care provider. Apply your  antifungal medicine as told by your health care provider. Do not stop using the antifungal even if your condition improves. Foot care Do not scratch your feet. Keep your feet dry: Wear cotton or wool socks. Change your socks every day or if they become wet. Wear shoes that allow air to flow, such as sandals or canvas tennis shoes. Wash and dry your feet, including the area between your toes. Also, wash and dry your feet: Every day or as told by your health care provider. After exercising. General instructions Do not let others use towels, shoes, nail clippers, or other personal items that touch your feet. Protect your feet by wearing sandals in wet areas, such as locker rooms and shared showers. Keep all follow-up visits. This is important. If you have diabetes, keep your blood sugar under control. Contact a health care provider if: You have a fever. You have swelling, soreness, warmth, or redness in your foot. Your feet are not getting better with treatment. Your symptoms get worse. You have new symptoms. You have severe pain. Summary Athlete's foot (tinea pedis) is a fungal infection of the skin on your feet. It often occurs on skin that is between or underneath the toes. This condition is caused by a fungus that grows in warm, moist places. Symptoms include white, flaky, or scaly areas between your toes or on the soles of your feet. This condition is treated with antifungal medicines. Keep your feet clean. Always dry them thoroughly. This information is not intended to replace advice given to you by  your health care provider. Make sure you discuss any questions you have with your health care provider. Document Revised: 11/05/2020 Document Reviewed: 11/05/2020 Elsevier Patient Education  2024 [employer].

## 2024-06-17 ENCOUNTER — Ambulatory Visit: Payer: Self-pay | Admitting: Physician Assistant
